# Patient Record
Sex: Female | Born: 1955 | Hispanic: Refuse to answer | Smoking: Former smoker
Health system: Southern US, Community
[De-identification: ages and names within clinical notes are randomized; demographics above are authoritative.]

## PROBLEM LIST (undated history)

## (undated) DIAGNOSIS — G4700 Insomnia, unspecified: Secondary | ICD-10-CM

## (undated) DIAGNOSIS — F32A Depression, unspecified: Secondary | ICD-10-CM

## (undated) DIAGNOSIS — F329 Major depressive disorder, single episode, unspecified: Secondary | ICD-10-CM

## (undated) HISTORY — DX: Depression, unspecified: F32.A

## (undated) HISTORY — DX: Major depressive disorder, single episode, unspecified: F32.9

## (undated) HISTORY — DX: Insomnia, unspecified: G47.00

---

## 1997-12-12 ENCOUNTER — Other Ambulatory Visit: Admission: RE | Admit: 1997-12-12 | Discharge: 1997-12-12 | Payer: Self-pay | Admitting: Obstetrics and Gynecology

## 1999-10-02 ENCOUNTER — Encounter: Payer: Self-pay | Admitting: Emergency Medicine

## 1999-10-02 ENCOUNTER — Emergency Department (HOSPITAL_COMMUNITY): Admission: EM | Admit: 1999-10-02 | Discharge: 1999-10-02 | Payer: Self-pay | Admitting: Emergency Medicine

## 2000-12-17 ENCOUNTER — Other Ambulatory Visit: Admission: RE | Admit: 2000-12-17 | Discharge: 2000-12-17 | Payer: Self-pay | Admitting: Obstetrics and Gynecology

## 2001-12-29 ENCOUNTER — Other Ambulatory Visit: Admission: RE | Admit: 2001-12-29 | Discharge: 2001-12-29 | Payer: Self-pay | Admitting: Obstetrics and Gynecology

## 2003-04-03 ENCOUNTER — Other Ambulatory Visit: Admission: RE | Admit: 2003-04-03 | Discharge: 2003-04-03 | Payer: Self-pay | Admitting: Obstetrics and Gynecology

## 2004-04-03 ENCOUNTER — Other Ambulatory Visit: Admission: RE | Admit: 2004-04-03 | Discharge: 2004-04-03 | Payer: Self-pay | Admitting: Obstetrics and Gynecology

## 2004-04-09 ENCOUNTER — Ambulatory Visit: Payer: Self-pay | Admitting: Internal Medicine

## 2004-04-16 ENCOUNTER — Ambulatory Visit: Payer: Self-pay | Admitting: Internal Medicine

## 2008-12-07 ENCOUNTER — Ambulatory Visit: Payer: Self-pay | Admitting: Family Medicine

## 2008-12-07 ENCOUNTER — Other Ambulatory Visit: Admission: RE | Admit: 2008-12-07 | Discharge: 2008-12-07 | Payer: Self-pay | Admitting: Family Medicine

## 2009-04-03 ENCOUNTER — Telehealth: Payer: Self-pay | Admitting: Family Medicine

## 2009-04-05 ENCOUNTER — Other Ambulatory Visit: Admission: RE | Admit: 2009-04-05 | Discharge: 2009-04-05 | Payer: Self-pay | Admitting: Family Medicine

## 2009-04-05 ENCOUNTER — Ambulatory Visit: Payer: Self-pay | Admitting: Family Medicine

## 2009-04-06 LAB — CONVERTED CEMR LAB
Chlamydia, DNA Probe: NEGATIVE
GC Probe Amp, Genital: NEGATIVE

## 2009-04-13 ENCOUNTER — Telehealth: Payer: Self-pay | Admitting: Family Medicine

## 2009-04-13 LAB — CONVERTED CEMR LAB

## 2009-05-03 ENCOUNTER — Ambulatory Visit: Payer: Self-pay | Admitting: Family Medicine

## 2009-05-03 LAB — CONVERTED CEMR LAB
Cholesterol: 203 mg/dL — ABNORMAL HIGH (ref 0–200)
Direct LDL: 121.1 mg/dL
HDL: 46.1 mg/dL (ref >39.00)
Total CHOL/HDL Ratio: 4
Triglycerides: 187 mg/dL — ABNORMAL HIGH (ref 0.0–149.0)
VLDL: 37.4 mg/dL (ref 0.0–40.0)

## 2009-05-25 ENCOUNTER — Encounter: Payer: Self-pay | Admitting: Family Medicine

## 2009-05-25 ENCOUNTER — Ambulatory Visit: Payer: Self-pay | Admitting: Family Medicine

## 2009-05-25 DIAGNOSIS — L02219 Cutaneous abscess of trunk, unspecified: Secondary | ICD-10-CM

## 2009-05-25 DIAGNOSIS — L03319 Cellulitis of trunk, unspecified: Secondary | ICD-10-CM

## 2009-05-28 ENCOUNTER — Ambulatory Visit: Payer: Self-pay | Admitting: Family Medicine

## 2009-06-07 ENCOUNTER — Ambulatory Visit: Payer: Self-pay | Admitting: Family Medicine

## 2009-07-26 ENCOUNTER — Ambulatory Visit: Payer: Self-pay | Admitting: Family Medicine

## 2009-08-01 ENCOUNTER — Telehealth (INDEPENDENT_AMBULATORY_CARE_PROVIDER_SITE_OTHER): Payer: Self-pay | Admitting: *Deleted

## 2009-09-04 ENCOUNTER — Ambulatory Visit: Payer: Self-pay | Admitting: Family Medicine

## 2009-09-04 DIAGNOSIS — J45909 Unspecified asthma, uncomplicated: Secondary | ICD-10-CM | POA: Insufficient documentation

## 2009-09-11 ENCOUNTER — Ambulatory Visit: Payer: Self-pay | Admitting: Family Medicine

## 2009-09-25 ENCOUNTER — Telehealth: Payer: Self-pay | Admitting: Family Medicine

## 2009-09-28 ENCOUNTER — Telehealth: Payer: Self-pay | Admitting: Family Medicine

## 2009-09-28 ENCOUNTER — Ambulatory Visit: Payer: Self-pay | Admitting: Family Medicine

## 2009-09-28 DIAGNOSIS — K219 Gastro-esophageal reflux disease without esophagitis: Secondary | ICD-10-CM

## 2009-09-28 IMAGING — CR DG CHEST 2V
2 series · 2 of 2 positions shown · non-contrast
Comparison: None.

CLINICAL DATA: Cough.

CHEST - 2 VIEW

[view not recorded (1 of 2)]
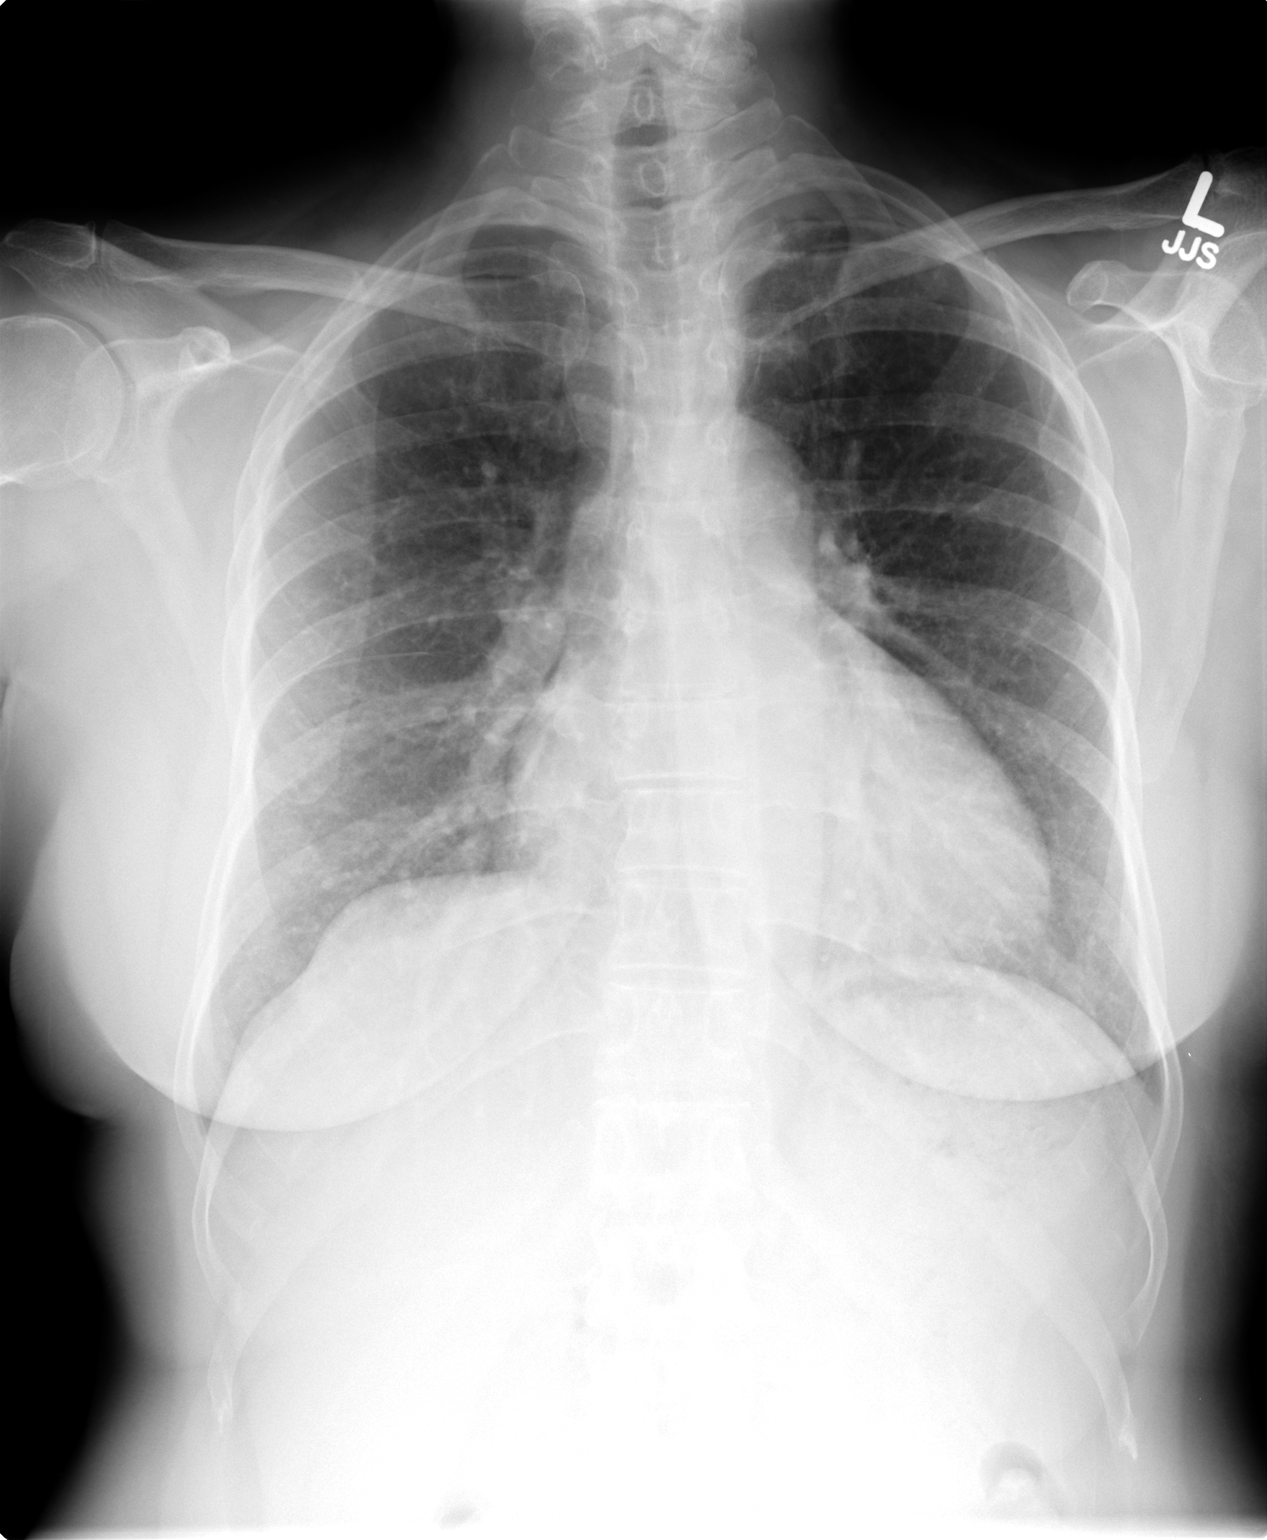

[view not recorded (2 of 2)]
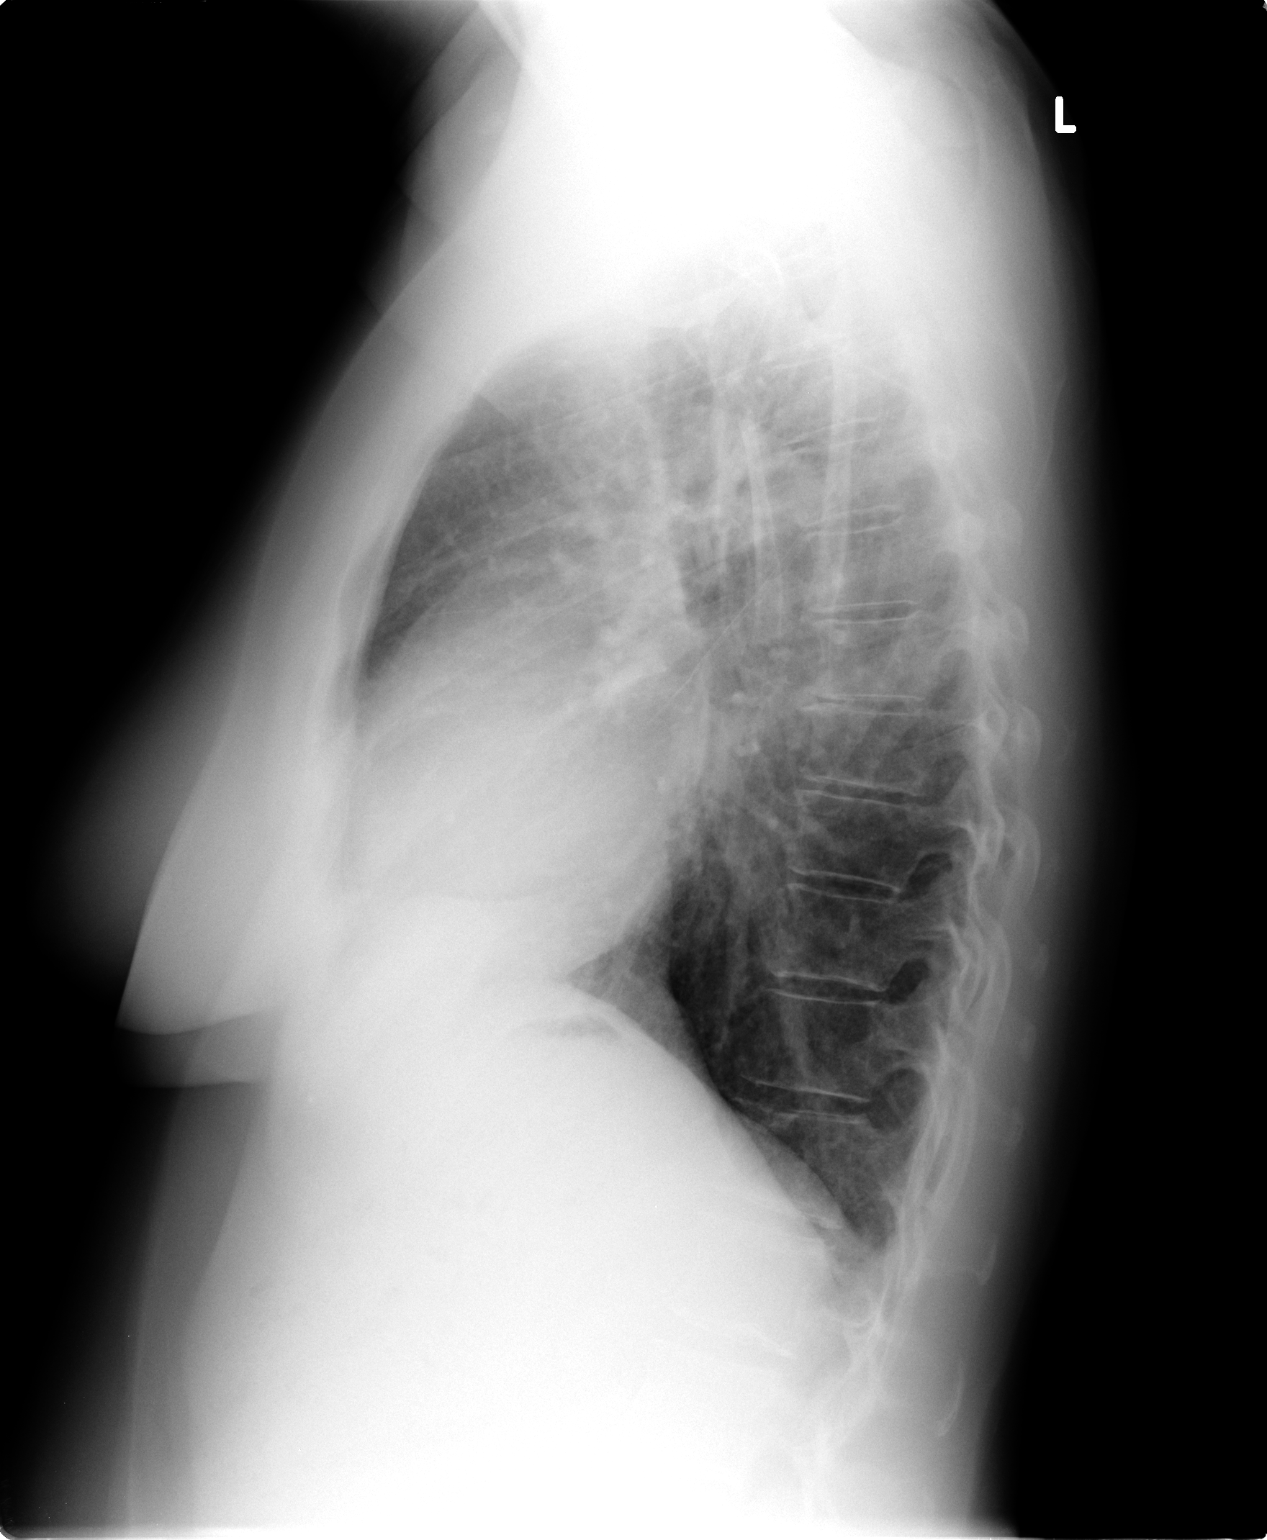

[2 of 2 positions shown; findings below may reference images not displayed]

FINDINGS: Trachea is midline.  Heart is at the upper limits of
normal in size.  Left apical pleural parenchymal scarring.  Lungs
are otherwise clear.  No pleural fluid.
IMPRESSION: No acute findings.

## 2009-10-01 ENCOUNTER — Telehealth: Payer: Self-pay | Admitting: Family Medicine

## 2009-11-12 ENCOUNTER — Ambulatory Visit: Payer: Self-pay | Admitting: Family Medicine

## 2009-11-12 DIAGNOSIS — A4902 Methicillin resistant Staphylococcus aureus infection, unspecified site: Secondary | ICD-10-CM | POA: Insufficient documentation

## 2009-11-12 DIAGNOSIS — Z8614 Personal history of Methicillin resistant Staphylococcus aureus infection: Secondary | ICD-10-CM

## 2009-11-13 ENCOUNTER — Encounter: Payer: Self-pay | Admitting: Family Medicine

## 2009-11-16 ENCOUNTER — Telehealth: Payer: Self-pay | Admitting: Family Medicine

## 2009-11-20 ENCOUNTER — Telehealth: Payer: Self-pay | Admitting: Family Medicine

## 2010-02-03 LAB — CONVERTED CEMR LAB
ALT: 33 units/L (ref 0–35)
AST: 34 units/L (ref 0–37)
Albumin: 4.3 g/dL (ref 3.5–5.2)
Alkaline Phosphatase: 77 units/L (ref 39–117)
BUN: 13 mg/dL (ref 6–23)
Basophils Absolute: 0 10*3/uL (ref 0.0–0.1)
Basophils Relative: 0.5 % (ref 0.0–3.0)
Bilirubin Urine: NEGATIVE
Bilirubin, Direct: 0 mg/dL (ref 0.0–0.3)
Blood in Urine, dipstick: NEGATIVE
CO2: 29 meq/L (ref 19–32)
Calcium: 9.5 mg/dL (ref 8.4–10.5)
Chloride: 105 meq/L (ref 96–112)
Cholesterol: 295 mg/dL — ABNORMAL HIGH (ref 0–200)
Creatinine, Ser: 0.7 mg/dL (ref 0.4–1.2)
Direct LDL: 201.4 mg/dL
Eosinophils Absolute: 0.2 10*3/uL (ref 0.0–0.7)
Eosinophils Relative: 2.5 % (ref 0.0–5.0)
GFR calc non Af Amer: 92.36 mL/min (ref >60)
Glucose, Bld: 90 mg/dL (ref 70–99)
Glucose, Urine, Semiquant: NEGATIVE
HCT: 40.2 % (ref 36.0–46.0)
HDL: 52.3 mg/dL (ref >39.00)
Hemoglobin: 13.5 g/dL (ref 12.0–15.0)
Ketones, urine, test strip: NEGATIVE
Lymphocytes Relative: 36.2 % (ref 12.0–46.0)
Lymphs Abs: 2.2 10*3/uL (ref 0.7–4.0)
MCHC: 33.6 g/dL (ref 30.0–36.0)
MCV: 89.4 fL (ref 78.0–100.0)
Monocytes Absolute: 0.5 10*3/uL (ref 0.1–1.0)
Monocytes Relative: 8.4 % (ref 3.0–12.0)
Neutro Abs: 3.3 10*3/uL (ref 1.4–7.7)
Neutrophils Relative %: 52.4 % (ref 43.0–77.0)
Nitrite: NEGATIVE
Platelets: 241 10*3/uL (ref 150.0–400.0)
Potassium: 3.8 meq/L (ref 3.5–5.1)
Protein, U semiquant: NEGATIVE
RBC: 4.49 M/uL (ref 3.87–5.11)
RDW: 12.6 % (ref 11.5–14.6)
Sodium: 142 meq/L (ref 135–145)
Specific Gravity, Urine: 1.025
TSH: 0.91 microintl units/mL (ref 0.35–5.50)
Total Bilirubin: 0.8 mg/dL (ref 0.3–1.2)
Total CHOL/HDL Ratio: 6
Total Protein: 7.9 g/dL (ref 6.0–8.3)
Triglycerides: 240 mg/dL — ABNORMAL HIGH (ref 0.0–149.0)
Urobilinogen, UA: 0.2
VLDL: 48 mg/dL — ABNORMAL HIGH (ref 0.0–40.0)
WBC Urine, dipstick: NEGATIVE
WBC: 6.2 10*3/uL (ref 4.5–10.5)
pH: 5

## 2010-02-05 NOTE — Progress Notes (Signed)
Summary: requesting Bactroban  Phone Note Call from Patient   Caller: Patient Call For: Roderick Pee MD Summary of Call: Pt is asking for Bactroban due to her history of MERSA. [phone] Initial call taken by: Lynann Beaver CMA,  August 01, 2009 11:38 AM  Follow-up for Phone Call        30 gms ok Follow-up by: Gordy Savers  MD,  August 02, 2009 8:05 AM    New/Updated Medications: BACTROBAN 2 % OINT (MUPIROCIN) Apply bid Prescriptions: BACTROBAN 2 % OINT (MUPIROCIN) Apply bid  #1 gm x 0   Entered by:   Lynann Beaver CMA   Authorized by:   Gordy Savers  MD   Signed by:   Lynann Beaver CMA on 08/02/2009   Method used:   Electronically to        [facility]  [phone]* (retail)       [address]       [place], [place]  [address]       Ph: [phone] or [phone]       Fax: [phone]   RxID:   [id number]  Pt. notified.

## 2010-02-05 NOTE — Progress Notes (Signed)
Summary: REQ FOR RETURN CALL?  Phone Note Call from Patient Call back at [phone]   Caller: Patient Reason for Call: Talk to Nurse Summary of Call: Pt called to speak with Fleet Contras, CMA (currently with a patient).....would not elaborate on reason for call.... Pt req a return call at your convenience @ [phone].  Initial call taken by: Debbra Riding,  April 13, 2009 2:33 PM  Follow-up for Phone Call        Phone Call Completed Follow-up by: Kern Reap CMA Duncan Dull),  April 13, 2009 2:48 PM

## 2010-02-05 NOTE — Progress Notes (Signed)
Summary: Pt req call back from [facility] today  Phone Note Call from Patient Call back at [facility] Phone [phone]   Caller: Patient Summary of Call: Pt is req a call back from [place] today.  Initial call taken by: Lucy Antigua,  November 20, 2009 11:21 AM  Follow-up for Phone Call        the 2 bumps are down but not all the way gone. should she finished the rx for ATB? Follow-up by: Kern Reap CMA Duncan Dull),  November 20, 2009 5:06 PM  Additional Follow-up for Phone Call Additional follow up Details #1::        continue the antibiotics until all the bumps are completely gone Additional Follow-up by: Roderick Pee MD,  November 20, 2009 5:29 PM    Additional Follow-up for Phone Call Additional follow up Details #2::    patient is aware Follow-up by: Kern Reap CMA Duncan Dull),  November 21, 2009 1:27 PM

## 2010-02-05 NOTE — Assessment & Plan Note (Signed)
Summary: congestion//ccm   Vital Signs:  Patient profile:   55 year old female Menstrual status:  postmenopausal Weight:      154 pounds Temp:     98.5 degrees F oral BP sitting:   110 / 78  (left arm) Cuff size:   regular  Vitals Entered By: Kern Reap CMA Duncan Dull) (November 12, 2009 12:14 PM) CC: MRSA concerns   CC:  MRSA concerns.  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  Last Tuesday she noticed a bump on her right thigh, one on her posterior left thigh, and two, bumps on her right labia.  Last night she began her antibiotics again.  This may interfere with the culture report.  Also, we discussed in the past trying to use pHisoHex.  She never got the prescription filled.  However, now she would like to try  Allergies: No Known Drug Allergies  Past History:  Past medical, surgical, family and social histories (including risk factors) reviewed, and no changes noted (except as noted below).  Past Medical History: Reviewed history from 09/28/2009 and no changes required. depression hx chicken pox high cholesterol IBS diagnosed at age 45 years of age with TB treatment???????  Past Surgical History: Reviewed history from 12/07/2008 and no changes required. Caesarean section x 2 Tonsillectomy  Family History: Reviewed history from 12/07/2008 and no changes required. Father: alcoholism, HTN, depression Mother: died - DM, HTN Siblings: 2 brothers - 1 died drugs/HIV               3 sisters - healthy Children: 2 - 1 boy and 1 girl - healthy  Social History: Reviewed history from 12/07/2008 and no changes required. 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 Regular exercise-yes  Review of Systems      See HPI  Physical Exam  General:  Well-developed,well-nourished,in no acute distress; alert,appropriate and cooperative throughout examination Skin:  one posterior  lesion, right anterior thigh.  A second posterior lesion posterior left thigh, which was cultured also to posterior lesions on her labia   Impression & Recommendations:  Problem # 1:  MRSA (OVF-643.32) Assessment Deteriorated  Orders: Specimen Handling ([id number]) T-Culture, Wound ([id number]) Prescription Created Electronically [phone])  Complete Medication List: 1)  Ativan 0.5 Mg Tabs (Lorazepam) .Marland Kitchen.. 1 tab @ bedtime as needed 2)  Bactroban 2 % Oint (Mupirocin) .... Apply bid 3)  Acyclovir 400 Mg Tabs (Acyclovir) .... Take 1 tablet by mouth three times a day 4)  Qvar 40 Mcg/act Aers (Beclomethasone dipropionate) .... One .puff two times a day 5)  Doxycycline Hyclate 100 Mg Caps (Doxycycline hyclate) .... Take 1 tablet by mouth two times a day 6)  Septra Ds 800-160 Mg Tabs (Sulfamethoxazole-trimethoprim) .... Take 1 tablet by mouth two times a day 7)  Phisohex 3 % Liqd (Hexachlorophene) .... Wash 3 x week as directed  Patient Instructions: 1)  in addition to dating daily with soap and water.  I would recommend you add the pHisoHex 3 times a week. 2)  Begin doxycycline and Septra one of each twice daily.  Return in two weeks for follow-up. Prescriptions: PHISOHEX 3 % LIQD (HEXACHLOROPHENE) wash 3 x week as directed  #1 pint x 6   Entered and Authorized by:   Roderick Pee MD   Signed by:   Roderick Pee MD on 11/12/2009   Method used:   Print then Give to Patient   RxID:  [id number] PHISOHEX 3 % LIQD (HEXACHLOROPHENE) wash 3 x week as directed  #1 pint x 6   Entered and Authorized by:   Roderick Pee MD   Signed by:   Roderick Pee MD on 11/12/2009   Method used:   Electronically to        [facility]  [phone]* (retail)       [address]       [place], [place]  [address]       Ph: [phone] or [phone]       Fax: [phone]   RxID:   [id number] SEPTRA DS 800-160 MG TABS (SULFAMETHOXAZOLE-TRIMETHOPRIM) Take 1  tablet by mouth two times a day  #60 x 1   Entered and Authorized by:   Roderick Pee MD   Signed by:   Roderick Pee MD on 11/12/2009   Method used:   Electronically to        [facility]  [phone]* (retail)       [address]       [place], [place]  [address]       Ph: [phone] or [phone]       Fax: [phone]   RxID:   [id number] DOXYCYCLINE HYCLATE 100 MG CAPS (DOXYCYCLINE HYCLATE) Take 1 tablet by mouth two times a day  #60 x 1   Entered and Authorized by:   Roderick Pee MD   Signed by:   Roderick Pee MD on 11/12/2009   Method used:   Electronically to        [facility]  [phone]* (retail)       [address]       [place], [place]  [address]       Ph: [phone] or [phone]       Fax: [phone]   RxID:   [id number]    Orders Added: 1)  T-Culture, Wound [87070/[id number]] 2)  Specimen Handling [99000] 3)  T-Culture, Wound [87070/[id number]] 4)  Est. Patient Level III [[address]] 5)  Prescription Created Electronically [phone]

## 2010-02-05 NOTE — Assessment & Plan Note (Signed)
Summary: follow up lab result - rv   Vital Signs:  Patient profile:   55 year old female Menstrual status:  postmenopausal BP sitting:   110 / 76  (left arm) Cuff size:   regular  Vitals Entered By: Kern Reap CMA Duncan Dull) (May 28, 2009 4:23 PM) CC: follow-up visit   CC:  follow-up visit.  History of Present Illness: Ronique is a 54 year old female, who comes in today for reevaluation of MRSA.  She had a boil in her left groin.  It was drained 4 days ago.  Culture showed MRSA with the usual sensitivities.  She's been on doxycycline 100 mg b.i.d., but it hasn't helped very much.  She also has a second smaller lesion in the right groin  Allergies: No Known Drug Allergies  Past History:  Past medical, surgical, family and social histories (including risk factors) reviewed for relevance to current acute and chronic problems.  Past Medical History: Reviewed history from 12/07/2008 and no changes required. depression hx chicken pox high cholesterol IBS  Past Surgical History: Reviewed history from 12/07/2008 and no changes required. Caesarean section x 2 Tonsillectomy  Family History: Reviewed history from 12/07/2008 and no changes required. Father: alcoholism, HTN, depression Mother: died - DM, HTN Siblings: 2 brothers - 1 died drugs/HIV               3 sisters - healthy Children: 2 - 1 boy and 1 girl - healthy  Social History: Reviewed history from 12/07/2008 and no changes required. 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 Regular exercise-yes  Review of Systems      See HPI  Physical Exam  General:  Well-developed,well-nourished,in no acute distress; alert,appropriate and cooperative throughout examination Skin:  golf ball-sized lesion, left groin 6 mm x 6 mm very small lesion, right groin to the left has a open port draining pus   Impression & Recommendations:  Problem # 1:  CELLULITIS AND ABSCESS OF TRUNK  (ICD-682.2) Assessment Unchanged  Her updated medication list for this problem includes:    Doxycycline Hyclate 100 Mg Caps (Doxycycline hyclate) .Marland Kitchen..Marland Kitchen Two times a day    Septra Ds 800-160 Mg Tabs (Sulfamethoxazole-trimethoprim) .Marland Kitchen... Take 1 tablet by mouth two times a day  Complete Medication List: 1)  Ativan 0.5 Mg Tabs (Lorazepam) .Marland Kitchen.. 1 tab @ bedtime as needed 2)  Doxycycline Hyclate 100 Mg Caps (Doxycycline hyclate) .... Two times a day 3)  Vicodin 5-500 Mg Tabs (Hydrocodone-acetaminophen) .Marland Kitchen.. 1 q 4 hours as needed pain 4)  Septra Ds 800-160 Mg Tabs (Sulfamethoxazole-trimethoprim) .... Take 1 tablet by mouth two times a day  Patient Instructions: 1)  continue the doxycycline, one twice a day, add  Septra, one twice a day.  Return Friday for follow-up.  Warm soaks 15 minutes 4 times a day Prescriptions: SEPTRA DS 800-160 MG TABS (SULFAMETHOXAZOLE-TRIMETHOPRIM) Take 1 tablet by mouth two times a day  #30 x 1   Entered and Authorized by:   Roderick Pee MD   Signed by:   Roderick Pee MD on 05/28/2009   Method used:   Electronically to        [facility]  [phone]* (retail)       [address]       [place], [place]  [address]       Ph: [phone] or [phone]       Fax: [phone]   RxID:   [id number]

## 2010-02-05 NOTE — Progress Notes (Signed)
Summary: dr Tawanna Cooler please call  Phone Note Call from Patient Call back at Home Phone [phone]   Caller: Patient Call For: Roderick Pee MD Summary of Call: pt decline to make ov, she is requesting to talk to doc first Initial call taken by: Heron Sabins,  April 03, 2009 9:10 AM  Follow-up for Phone Call        left message on machine for patient to return our call Follow-up by: Kern Reap CMA Duncan Dull),  April 03, 2009 10:02 AM  Additional Follow-up for Phone Call Additional follow up Details #1::        spoke with patient and she refused to give any information and requests to only speak with Dr. Tawanna Cooler Additional Follow-up by: Kern Reap CMA Duncan Dull),  April 03, 2009 10:07 AM    Additional Follow-up for Phone Call Additional follow up Details #2::    I called the home with no answer........ I called her work number and it rang, and rang, and nobody answered...........Marland Kitchen finally called the work number again, and got an answering machine.  Requested.  Patient come to the office for eval Follow-up by: Roderick Pee MD,  April 03, 2009 1:07 PM

## 2010-02-05 NOTE — Progress Notes (Signed)
  Phone Note Outgoing Call   Summary of Call: I called Melissa Welch in to tell her her chest x-ray was normal Initial call taken by: Roderick Pee MD,  September 28, 2009 5:32 PM

## 2010-02-05 NOTE — Assessment & Plan Note (Signed)
Summary: rash on buttocks/dm   Vital Signs:  Patient profile:   55 year old female Menstrual status:  postmenopausal Weight:      145 pounds Temp:     98.1 degrees F oral BP sitting:   120 / 70  (left arm) Cuff size:   regular  Vitals Entered By: Kathrynn Speed CMA (September 11, 2009 11:39 AM) CC: rash on buttocks, x 2 days, been  a beach, src   CC:  rash on buttocks, x 2 days, been  a beach, and src.  History of Present Illness: Melissa Welch is a 55 year old female, who comes in today for evaluation of a painful rash on her buttocks.  Over the weekend.  She was at the beach and noticed pain in them broke out in a rash.  No previous history of shingles in the past, except that her GYN had given her some Zovirax back in 2008 which sounds like it was for more like cutaneous herpes.  Current Medications (verified): 1)  Ativan 0.5 Mg Tabs (Lorazepam) .Marland Kitchen.. 1 Tab @ Bedtime As Needed 2)  Phisohex 3 % Liqd (Hexachlorophene) .... Apply 3 X Week 3)  Bactroban 2 % Oint (Mupirocin) .... Apply Bid 4)  Prednisone 20 Mg Tabs (Prednisone) .... Uad  Allergies (verified): No Known Drug Allergies  Past History:  Past medical, surgical, family and social histories (including risk factors) reviewed for relevance to current acute and chronic problems.  Past Medical History: Reviewed history from 12/07/2008 and no changes required. depression hx chicken pox high cholesterol IBS  Past Surgical History: Reviewed history from 12/07/2008 and no changes required. Caesarean section x 2 Tonsillectomy  Family History: Reviewed history from 12/07/2008 and no changes required. Father: alcoholism, HTN, depression Mother: died - DM, HTN Siblings: 2 brothers - 1 died drugs/HIV               3 sisters - healthy Children: 2 - 1 boy and 1 girl - healthy  Social History: Reviewed history from 12/07/2008 and no changes required. 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 1 tab @ bedtime as needed 2)  Phisohex 3 % Liqd (Hexachlorophene) .... Apply 3 x week 3)  Bactroban 2 % Oint (Mupirocin) .... Apply bid 4)  Prednisone 20 Mg Tabs (Prednisone) .... Uad 5)  Acyclovir 400 Mg Tabs (Acyclovir) .... Take 1 tablet by mouth three times a day  Patient Instructions: 1)  begin Zovirax 400 mg 3 times a day.  Also, Motrin, 600 mg twice a day...............and Vicodin one half to one tablet at bedtime as needed for severe pain 2)  Please schedule a follow-up appointment as needed. Prescriptions: ACYCLOVIR 400 MG TABS (ACYCLOVIR) Take 1 tablet by mouth three times a day  #30 x 2   Entered and Authorized by:   Roderick Pee MD   Signed by:   Roderick Pee MD on 09/11/2009   Method used:  Electronically to        [facility]  [phone]* (retail)       [address]       [place], [place]  [address]       Ph: [phone] or [phone]       Fax: [phone]   RxID:   [id number]

## 2010-02-05 NOTE — Assessment & Plan Note (Signed)
Summary: fup cough//ccm   Vital Signs:  Patient profile:   55 year old female Menstrual status:  postmenopausal Weight:      150 pounds Temp:     98.2 degrees F oral BP sitting:   110 / 80  (left arm) Cuff size:   regular  Vitals Entered By: Kern Reap CMA Duncan Dull) (September 28, 2009 1:58 PM) CC: chest congestion, cough   CC:  chest congestion and cough.  History of Present Illness: Melissa Welch is a 55 year old nonsmoking female, who comes in today with a recurrent cough.  Two months ago, she developed some head congestion, postnasal drip, and a slight cough.  We saw her on August 30 because the cough had gotten worse.  At that time on physical examination she was wheezing.  We placed her burst and prednisone taper, which she finished September, the 16th however, the cough, although decreased.  Has not gone.  Again, no fever, chills, sputum production, Septra.  No environmental changes.  She also had an episode of Mercer in the left groin that is resolved.  Also she had an episode of shingles in the left buttocks.  The result of acyclovir.  She's wondering at the something wrong with her immune system.  She states she was diagnosed with TB when she was 55 years of age.  Does not recall the type of therapy.  Review of systems pertinent in, the she's also having symptoms of reflux esophagitis.  She states a couple nights a week, when she lies, down.  She'll feel burning in her chest.  We discussed the possible link  between reflux and wheezing  Allergies: No Known Drug Allergies  Past History:  Past medical, surgical, family and social histories (including risk factors) reviewed for relevance to current acute and chronic problems.  Past Medical History: depression hx chicken pox high cholesterol IBS diagnosed at age 63 years of age with TB treatment???????  Past Surgical History: Reviewed history from 12/07/2008 and no changes required. Caesarean section x  2 Tonsillectomy  Family History: Reviewed history from 12/07/2008 and no changes required. Father: alcoholism, HTN, depression Mother: died - DM, HTN Siblings: 2 brothers - 1 died drugs/HIV               3 sisters - healthy Children: 2 - 1 boy and 1 girl - healthy  Social History: Reviewed history from 12/07/2008 and no changes required. 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 No apparent alopecia or balding. Eyes:  No corneal or conjunctival inflammation noted. EOMI. Perrla. Funduscopic exam benign, without hemorrhages, exudates or papilledema. Vision grossly normal. Ears:  External ear exam shows no significant lesions or deformities.  Otoscopic examination reveals clear canals, tympanic membranes are intact bilaterally without bulging, retraction, inflammation or discharge. Hearing is grossly normal bilaterally. Nose:  External nasal examination shows no deformity or inflammation. Nasal mucosa are pink and moist without lesions or exudates. Mouth:  Oral mucosa and oropharynx without lesions or exudates.  Teeth in good repair. Neck:  No deformities, masses, or tenderness noted. Chest Wall:  No deformities, masses, or tenderness noted. Lungs:  Normal respiratory effort, chest expands symmetrically. Lungs are clear to auscultation, no crackles or wheezes. Heart:  Normal rate and regular rhythm. S1 and S2 normal without gallop, murmur, click,  rub or other extra sounds.   Problems:  Medical Problems Added: 1)  Dx of Gerd  (ICD-530.81) 2)  Dx of Cough  (ICD-786.2)  Impression & Recommendations:  Problem # 1:  GERD (ICD-530.81) Assessment New  Orders: Prescription Created Electronically  [phone])  Problem # 2:  COUGH (ICD-786.2) Assessment: Unchanged  Orders: Prescription Created Electronically [phone])  Complete Medication List: 1)  Ativan 0.5 Mg Tabs (Lorazepam) .Marland Kitchen.. 1 tab @ bedtime as needed 2)  Phisohex 3 % Liqd (Hexachlorophene) .... Apply 3 x week 3)  Bactroban 2 % Oint (Mupirocin) .... Apply bid 4)  Acyclovir 400 Mg Tabs (Acyclovir) .... Take 1 tablet by mouth three times a day 5)  Qvar 40 Mcg/act Aers (Beclomethasone dipropionate) .... One .puff two times a day  Other Orders: T-2 View CXR ([id number])  Patient Instructions: 1)  begin Qvar one puff twice daily, begin B. anti-reflux program.  The things to continue to avoid on nicotine, alcohol, caffeine, peppermint, and take Prilosec 20 mg b.i.d., and nothing to eat or drink for two hours prior to bedtime and sleep on two pillows.  Return in 3 weeks for follow-up, sooner if any problems Prescriptions: QVAR 40 MCG/ACT AERS (BECLOMETHASONE DIPROPIONATE) one .puff two times a day  #1 x 1   Entered and Authorized by:   Roderick Pee MD   Signed by:   Roderick Pee MD on 09/28/2009   Method used:   Electronically to        [facility]  [phone]* (retail)       [address]       [place], [place]  [address]       Ph: [phone] or [phone]       Fax: [phone]   RxID:   [id number]

## 2010-02-05 NOTE — Progress Notes (Signed)
Summary: pt req chest xray for cough  Phone Note Call from Patient Call back at Home Phone [phone]   Caller: Patient Call For: Roderick Pee MD Summary of Call: pt has dry cough would like chest xray. pt was seen in aug 2011 for her cough Initial call taken by: Heron Sabins,  September 25, 2009 11:58 AM  Follow-up for Phone Call        Fleet Contras please call Byrd Hesselbach, since she's not well and he does see her today for a full evaluation Follow-up by: Roderick Pee MD,  September 25, 2009 12:33 PM  Additional Follow-up for Phone Call Additional follow up Details #1::        patient is aware and will call back for appointment Additional Follow-up by: Kern Reap CMA Duncan Dull),  September 25, 2009 12:40 PM

## 2010-02-05 NOTE — Assessment & Plan Note (Signed)
Summary: ? boil//ccm   Vital Signs:  Patient profile:   55 year old female Menstrual status:  postmenopausal Weight:      146 pounds Temp:     98.8 degrees F oral BP sitting:   124 / 70  (left arm) Cuff size:   regular  Vitals Entered By: Raechel Ache, RN (May 25, 2009 2:44 PM) CC: C/o ? boil L groin since Tuesday, getting worse and very painful. Has another spot R side. Genitalia burning. Chills   History of Present Illness: Here with 4 days of a painful swollen boil in the left groin. She also has a much smaller one in the right groin. She wonders if these could be spider bites since she was up in her attic around the time they appeared. The boil in the left groin has steadily gotten larger and more painful. No fevers or other symptoms.   Allergies (verified): No Known Drug Allergies  Past History:  Past Medical History: Reviewed history from 12/07/2008 and no changes required. depression hx chicken pox high cholesterol IBS  Past Surgical History: Reviewed history from 12/07/2008 and no changes required. Caesarean section x 2 Tonsillectomy  Review of Systems  The patient denies anorexia, fever, weight loss, weight gain, vision loss, decreased hearing, hoarseness, chest pain, syncope, dyspnea on exertion, peripheral edema, prolonged cough, headaches, hemoptysis, abdominal pain, melena, hematochezia, severe indigestion/heartburn, hematuria, incontinence, genital sores, muscle weakness, suspicious skin lesions, transient blindness, difficulty walking, depression, unusual weight change, abnormal bleeding, enlarged lymph nodes, angioedema, breast masses, and testicular masses.    Physical Exam  General:  in pain, alert Abdomen:  there is a tiny slightly tender pustule in the left groin. There is a large, red, swollen, warm and very tender boil in the left groin.    Impression & Recommendations:  Problem # 1:  CELLULITIS AND ABSCESS OF TRUNK (ICD-682.2)  Her updated  medication list for this problem includes:    Doxycycline Hyclate 100 Mg Caps (Doxycycline hyclate) .Marland Kitchen..Marland Kitchen Two times a day  Orders: T-Culture, Wound (87070/87205-70190) I&D Abscess, Simple / Single (10060) Rocephin  250mg  (Z6109) Admin of Therapeutic Inj  intramuscular or subcutaneous (60454)  Complete Medication List: 1)  Ativan 0.5 Mg Tabs (Lorazepam) .Marland Kitchen.. 1 tab @ bedtime as needed 2)  Doxycycline Hyclate 100 Mg Caps (Doxycycline hyclate) .... Two times a day 3)  Vicodin 5-500 Mg Tabs (Hydrocodone-acetaminophen) .Marland Kitchen.. 1 q 4 hours as needed pain  Patient Instructions: 1)  This is concerning for a MRSA infection, so we will cover with Doxycycline. The area was cleansed with alcohol, then we incised the boil with a scalpel. The contents were swabbed and we sent this off for a culture. Dressed with gauze. Given a Rocephin shot. She can soak in a hot bathtub for comfort.  Prescriptions: VICODIN 5-500 MG TABS (HYDROCODONE-ACETAMINOPHEN) 1 q 4 hours as needed pain  #30 x 0   Entered and Authorized by:   Nelwyn Salisbury MD   Signed by:   Nelwyn Salisbury MD on 05/25/2009   Method used:   Print then Give to Patient   RxID:   [id number] DOXYCYCLINE HYCLATE 100 MG CAPS (DOXYCYCLINE HYCLATE) two times a day  #28 x 0   Entered and Authorized by:   Nelwyn Salisbury MD   Signed by:   Nelwyn Salisbury MD on 05/25/2009   Method used:   Print then Give to Patient   RxID:   [id number]    Medication Administration  Injection #  1:    Medication: Rocephin  250mg     Diagnosis: CELLULITIS AND ABSCESS OF TRUNK (ICD-682.2)    Route: IM    Site: LUOQ gluteus    Exp Date: 11/13    Lot #: [id number]    Mfr: novaplus    Comments: 1 gram given    Patient tolerated injection without complications    Given by: Raechel Ache, RN (May 25, 2009 4:25 PM)  Orders Added: 1)  T-Culture, Wound [87070/[id number]] 2)  I&D Abscess, Simple / Single [10060] 3)  Rocephin  250mg  [J0696] 4)  Admin of Therapeutic Inj   intramuscular or subcutaneous [82956]

## 2010-02-05 NOTE — Assessment & Plan Note (Signed)
Summary: CHEST CONGESTION/NJR   Vital Signs:  Patient profile:   55 year old female Menstrual status:  postmenopausal Height:      61 inches Weight:      153 pounds BMI:     29.01 Temp:     98.3 degrees F oral BP sitting:   130 / 90  (left arm) Cuff size:   regular  Vitals Entered By: Kern Reap CMA Duncan Dull) (September 04, 2009 12:18 PM) CC: chest congestion   CC:  chest congestion.  History of Present Illness: Margan is a 55 year old female, nonsmoker, who comes in today for evaluation of asthma.  She has a history of intermittent allergic rhinitis.  A month ago she began coughing.  Interestingly enough her sneezing, runny nose, etc., when away when the wheezing started.  No history of asthma in the past.  Environmental review of systems negative  Allergies: No Known Drug Allergies  Past History:  Past Medical History: Last updated: 12/07/2008 depression hx chicken pox high cholesterol IBS  Family History: Last updated: 12/07/2008 Father: alcoholism, HTN, depression Mother: died - DM, HTN Siblings: 2 brothers - 1 died drugs/HIV               3 sisters - healthy Children: 2 - 1 boy and 1 girl - healthy  Social History: Last updated: 12/07/2008 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 Regular exercise-yes  Social History: Reviewed history from 12/07/2008 and no changes required. 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 No apparent alopecia or balding. Eyes:  No corneal or conjunctival inflammation noted. EOMI. Perrla. Funduscopic exam benign, without hemorrhages, exudates or papilledema. Vision grossly normal. Ears:  External ear exam shows no  significant lesions or deformities.  Otoscopic examination reveals clear canals, tympanic membranes are intact bilaterally without bulging, retraction, inflammation or discharge. Hearing is grossly normal bilaterally. Nose:  External nasal examination shows no deformity or inflammation. Nasal mucosa are pink and moist without lesions or exudates. Mouth:  Oral mucosa and oropharynx without lesions or exudates.  Teeth in good repair. Neck:  No deformities, masses, or tenderness noted. Chest Wall:  No deformities, masses, or tenderness noted. Lungs:  symmetrical breath sounds with expiratory wheezing bilaterally   Problems:  Medical Problems Added: 1)  Dx of Asthma  (ICD-493.90)  Impression & Recommendations:  Problem # 1:  ASTHMA (ICD-493.90) Assessment New  Her updated medication list for this problem includes:    Prednisone 20 Mg Tabs (Prednisone) ..... Uad  Orders: Prescription Created Electronically [phone])  Complete Medication List: 1)  Ativan 0.5 Mg Tabs (Lorazepam) .Marland Kitchen.. 1 tab @ bedtime as needed 2)  Phisohex 3 % Liqd (Hexachlorophene) .... Apply 3 x week 3)  Bactroban 2 % Oint (Mupirocin) .... Apply bid 4)  Prednisone 20 Mg Tabs (Prednisone) .... Uad  Patient Instructions: 1)  begin prednisone 20-mg tablets directions two tabs x 3 days, one x 3 days, a half x 3 days, then half a tablet Monday, Wednesday, Friday, for a two week taper.  Once he stopped the prednisone completely then take a plain Claritin in the morning or a plain Zyrtec at bedtime daily Prescriptions: PREDNISONE 20 MG TABS (PREDNISONE) UAD  #30 x 1   Entered and Authorized by:   Roderick Pee MD  Signed by:   Roderick Pee MD on 09/04/2009   Method used:   Electronically to        [facility]  [phone]* (retail)       [address]       [place], [place]  [address]       Ph: [phone] or [phone]       Fax: [phone]   RxID:   [id number]

## 2010-02-05 NOTE — Assessment & Plan Note (Signed)
Summary: PERSONAL CONCERNS // RS   Vital Signs:  Patient profile:   55 year old female Menstrual status:  postmenopausal Weight:      150 pounds Temp:     98.3 degrees F oral BP sitting:   110 / 80  (left arm) Cuff size:   regular  Vitals Entered By: Kern Reap CMA Duncan Dull) (April 05, 2009 12:20 PM) CC: std   CC:  std.  History of Present Illness: Melissa Welch is a 55 year old single female, nonsmoker, who has been in a stable relationship for over 3 years.  He now finds out that her S. O. has had multiple girlfriends.  She has no symptoms of any STDs, but she wants to be screened.  That had vaginal and oral but no anal sex.    Allergies: No Known Drug Allergies  Past History:  Past medical, surgical, family and social histories (including risk factors) reviewed for relevance to current acute and chronic problems.  Past Medical History: Reviewed history from 12/07/2008 and no changes required. depression hx chicken pox high cholesterol IBS  Past Surgical History: Reviewed history from 12/07/2008 and no changes required. Caesarean section x 2 Tonsillectomy  Family History: Reviewed history from 12/07/2008 and no changes required. Father: alcoholism, HTN, depression Mother: died - DM, HTN Siblings: 2 brothers - 1 died drugs/HIV               3 sisters - healthy Children: 2 - 1 boy and 1 girl - healthy  Social History: Reviewed history from 12/07/2008 and no changes required. 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 Genitalia:  Pelvic Exam:        External: normal female genitalia without lesions or masses        Vagina: normal without lesions  or masses        Cervix: normal without lesions or masses        Adnexa: normal bimanual exam without masses or fullness        Uterus: normal by palpation        Pap smear: not performed   Impression & Recommendations:  Problem # 1:  SCREENING OTHER&UNSPEC GENITOURINARY CONDITION (ICD-V81.6) Assessment New  Orders: Venipuncture (29562) T-HIV Antibody  (Reflex) ([id number]) T-GC Probe, genital (96295-28413) T-Chlamydia Probe, genital (24401-02725)  Complete Medication List: 1)  Ativan 0.5 Mg Tabs (Lorazepam) .Marland Kitchen.. 1 tab @ bedtime as needed  Patient Instructions: 1)  I will call you when I get your lab work back Prescriptions: ATIVAN 0.5 MG TABS (LORAZEPAM) 1 tab @ bedtime as needed  #30 x 2   Entered and Authorized by:   Roderick Pee MD   Signed by:   Roderick Pee MD on 04/05/2009   Method used:   Print then Give to Patient   RxID:   [id number]

## 2010-02-05 NOTE — Progress Notes (Signed)
Summary: rx for phisonex?  Phone Note Call from Patient   Caller: Patient Call For: Roderick Pee MD Reason for Call: Talk to Doctor Summary of Call: patient is calling because the rx for phisonex will cost $50 is there anything else she can use? Initial call taken by: Kern Reap CMA Duncan Dull),  November 16, 2009 2:04 PM  Follow-up for Phone Call        no Follow-up by: Roderick Pee MD,  November 19, 2009 8:27 AM  Additional Follow-up for Phone Call Additional follow up Details #1::        left message on machine for patient  Additional Follow-up by: Kern Reap CMA Duncan Dull),  November 19, 2009 12:02 PM

## 2010-02-05 NOTE — Assessment & Plan Note (Signed)
Summary: lump in armpit/painful/cjr   Vital Signs:  Patient profile:   55 year old female Menstrual status:  postmenopausal Weight:      145 pounds Temp:     98 degrees F oral BP sitting:   120 / 80  (right arm)  History of Present Illness: Melissa Welch is a 55 year old female, who comes in today for evaluation of a red, swollen, lesion in her left axillary area x 3 days.  She also has a bump on her right groin.  In May.  She had MRSA.......Marland Kitchen we treated her the results spontaneously.  Now for the last 3 days, she she's noticed a sore lump in her left axillary and also a small pimple in her right groin  Allergies: No Known Drug Allergies  Past History:  Past medical, surgical, family and social histories (including risk factors) reviewed for relevance to current acute and chronic problems.  Past Medical History: Reviewed history from 12/07/2008 and no changes required. depression hx chicken pox high cholesterol IBS  Past Surgical History: Reviewed history from 12/07/2008 and no changes required. Caesarean section x 2 Tonsillectomy  Family History: Reviewed history from 12/07/2008 and no changes required. Father: alcoholism, HTN, depression Mother: died - DM, HTN Siblings: 2 brothers - 1 died drugs/HIV               3 sisters - healthy Children: 2 - 1 boy and 1 girl - healthy  Social History: Reviewed history from 12/07/2008 and no changes required. 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 Take 1 tablet by mouth two times a day    Doxycycline Hyclate 100 Mg Caps (Doxycycline hyclate) .Marland Kitchen... Take 1 tablet by mouth two times a day  Orders: Prescription Created Electronically [phone])  Complete Medication List: 1)  Ativan 0.5 Mg Tabs (Lorazepam) .Marland Kitchen.. 1 tab @ bedtime as needed 2)  Vicodin 5-500 Mg Tabs (Hydrocodone-acetaminophen) .Marland Kitchen.. 1 q 4 hours as needed pain 3)  Septra Ds 800-160 Mg Tabs (Sulfamethoxazole-trimethoprim) .... Take 1 tablet by mouth two times a day 4)  Doxycycline Hyclate 100 Mg Caps (Doxycycline hyclate) .... Take 1 tablet by mouth two times a day 5)  Phisohex 3 % Liqd (Hexachlorophene) .... Apply 3 x week  Patient Instructions: 1)  doxycycline 100 mg twice a day, along with Septra DS twice daily.  Also scrubbed the areas with pHisoHex soap 3 times weekly.  Return p.r.n. Prescriptions: SEPTRA DS 800-160 MG TABS (SULFAMETHOXAZOLE-TRIMETHOPRIM) Take 1 tablet by mouth two times a day  #40 x 1   Entered and Authorized by:   Roderick Pee MD   Signed by:   Roderick Pee MD on 07/26/2009   Method used:   Electronically to        [facility]  [phone]* (retail)       [address]       [place], [place]  [address]       Ph: [phone] or [phone]  Fax: [phone]   RxID:   [id number] PHISOHEX 3 % LIQD (HEXACHLOROPHENE) apply 3 x week  #1 pint x 3   Entered and Authorized by:   Roderick Pee MD   Signed by:   Roderick Pee MD on 07/26/2009   Method used:   Electronically to        [facility]  [phone]* (retail)       [address]       [place], [place]  [address]       Ph: [phone] or [phone]       Fax: [phone]   RxID:   [id number] DOXYCYCLINE HYCLATE 100 MG CAPS (DOXYCYCLINE HYCLATE) Take 1 tablet by mouth two times a day  #40 x 1   Entered and Authorized by:   Roderick Pee MD   Signed by:   Roderick Pee MD on 07/26/2009   Method used:   Electronically to        [facility]  [phone]* (retail)       [address]       [place], [place]  [address]       Ph: [phone] or [phone]       Fax: [phone]   RxID:   [id number]

## 2010-02-05 NOTE — Assessment & Plan Note (Signed)
Summary: fup per dr//ccm/pt rescd//ccm   Vital Signs:  Patient profile:   55 year old female Menstrual status:  postmenopausal BP sitting:   120 / 80  (left arm) Cuff size:   regular  Vitals Entered By: Kern Reap CMA Duncan Dull) (June 07, 2009 3:13 PM) CC: follow-up visit, drainage and cough   CC:  follow-up visit and drainage and cough.  History of Present Illness: Teagan is a 55 year old female comes in today for evaluation of an abscess in her left groin.  She's been treating this with hot soaks and antibiotics.  She is on doxy and Septra.  Is completely healed except for his hard lump there.  She's also had two days history of sore throat, head congestion, nonproductive cough, with no fever  Allergies: No Known Drug Allergies  Review of Systems      See HPI  Physical Exam  General:  Well-developed,well-nourished,in no acute distress; alert,appropriate and cooperative throughout examination Head:  Normocephalic and atraumatic without obvious abnormalities. No apparent alopecia or balding. Eyes:  No corneal or conjunctival inflammation noted. EOMI. Perrla. Funduscopic exam benign, without hemorrhages, exudates or papilledema. Vision grossly normal. Ears:  External ear exam shows no significant lesions or deformities.  Otoscopic examination reveals clear canals, tympanic membranes are intact bilaterally without bulging, retraction, inflammation or discharge. Hearing is grossly normal bilaterally. Nose:  External nasal examination shows no deformity or inflammation. Nasal mucosa are pink and moist without lesions or exudates. Mouth:  Oral mucosa and oropharynx without lesions or exudates.  Teeth in good repair. Neck:  No deformities, masses, or tenderness noted. Lungs:  Normal respiratory effort, chest expands symmetrically. Lungs are clear to auscultation, no crackles or wheezes. Skin:  the abscess in the left groin is well-healed.  There is a persistent 5 mm x 5 mm, hard  lesion   Problems:  Medical Problems Added: 1)  Dx of Viral Infection-unspec  (ICD-079.99)  Impression & Recommendations:  Problem # 1:  VIRAL INFECTION-UNSPEC (ICD-079.99) Assessment New  Problem # 2:  CELLULITIS AND ABSCESS OF TRUNK (ICD-682.2) Assessment: Improved  Her updated medication list for this problem includes:    Septra Ds 800-160 Mg Tabs (Sulfamethoxazole-trimethoprim) .Marland Kitchen... Take 1 tablet by mouth two times a day  Complete Medication List: 1)  Ativan 0.5 Mg Tabs (Lorazepam) .Marland Kitchen.. 1 tab @ bedtime as needed 2)  Vicodin 5-500 Mg Tabs (Hydrocodone-acetaminophen) .Marland Kitchen.. 1 q 4 hours as needed pain 3)  Septra Ds 800-160 Mg Tabs (Sulfamethoxazole-trimethoprim) .... Take 1 tablet by mouth two times a day  Patient Instructions: 1)  return p.r.n.

## 2010-02-05 NOTE — Progress Notes (Signed)
Summary: name of allergy  Phone Note Call from Patient Call back at Home Phone [phone]   Caller: vm Summary of Call: Dr. Karie Schwalbe mentioned an allergy of some sort.  What is it?  She wants to look it up.  Initial call taken by: Rudy Jew, RN,  October 01, 2009 2:30 PM  Follow-up for Phone Call        Fleet Contras please call one not sure, what she's talking about Follow-up by: Roderick Pee MD,  October 01, 2009 4:10 PM  Additional Follow-up for Phone Call Additional follow up Details #1::        Phone Call Completed Additional Follow-up by: Kern Reap CMA Duncan Dull),  October 02, 2009 5:13 PM

## 2011-02-17 ENCOUNTER — Other Ambulatory Visit (HOSPITAL_COMMUNITY)
Admission: RE | Admit: 2011-02-17 | Discharge: 2011-02-17 | Disposition: A | Discharge status: Home / Self Care | Payer: No Typology Code available for payment source | Source: Ambulatory Visit | Attending: Gynecology | Admitting: Gynecology

## 2011-02-17 ENCOUNTER — Telehealth: Payer: Self-pay | Admitting: *Deleted

## 2011-02-17 ENCOUNTER — Encounter: Payer: Self-pay | Admitting: Gynecology

## 2011-02-17 ENCOUNTER — Ambulatory Visit (INDEPENDENT_AMBULATORY_CARE_PROVIDER_SITE_OTHER): Payer: No Typology Code available for payment source | Admitting: Gynecology

## 2011-02-17 DIAGNOSIS — F411 Generalized anxiety disorder: Secondary | ICD-10-CM

## 2011-02-17 DIAGNOSIS — N760 Acute vaginitis: Secondary | ICD-10-CM

## 2011-02-17 DIAGNOSIS — G4700 Insomnia, unspecified: Secondary | ICD-10-CM

## 2011-02-17 DIAGNOSIS — F329 Major depressive disorder, single episode, unspecified: Secondary | ICD-10-CM

## 2011-02-17 DIAGNOSIS — A499 Bacterial infection, unspecified: Secondary | ICD-10-CM

## 2011-02-17 DIAGNOSIS — Z01419 Encounter for gynecological examination (general) (routine) without abnormal findings: Secondary | ICD-10-CM | POA: Insufficient documentation

## 2011-02-17 DIAGNOSIS — N898 Other specified noninflammatory disorders of vagina: Secondary | ICD-10-CM

## 2011-02-17 DIAGNOSIS — Z1211 Encounter for screening for malignant neoplasm of colon: Secondary | ICD-10-CM

## 2011-02-17 DIAGNOSIS — F419 Anxiety disorder, unspecified: Secondary | ICD-10-CM

## 2011-02-17 DIAGNOSIS — N949 Unspecified condition associated with female genital organs and menstrual cycle: Secondary | ICD-10-CM

## 2011-02-17 DIAGNOSIS — N951 Menopausal and female climacteric states: Secondary | ICD-10-CM

## 2011-02-17 DIAGNOSIS — B9689 Other specified bacterial agents as the cause of diseases classified elsewhere: Secondary | ICD-10-CM

## 2011-02-17 DIAGNOSIS — R635 Abnormal weight gain: Secondary | ICD-10-CM

## 2011-02-17 LAB — WET PREP FOR TRICH, YEAST, CLUE
Clue Cells Wet Prep HPF POC: NONE SEEN
Trich, Wet Prep: NONE SEEN
Yeast Wet Prep HPF POC: NONE SEEN

## 2011-02-17 LAB — URINALYSIS W MICROSCOPIC + REFLEX CULTURE
Bilirubin Urine: NEGATIVE
Casts: NONE SEEN
Crystals: NONE SEEN
Glucose, UA: NEGATIVE mg/dL
Ketones, ur: NEGATIVE mg/dL
Leukocytes, UA: NEGATIVE
Nitrite: NEGATIVE
Protein, ur: NEGATIVE mg/dL
Specific Gravity, Urine: 1.02 (ref 1.005–1.030)
Urobilinogen, UA: NEGATIVE mg/dL (ref 0.0–1.0)
pH: 6 (ref 5.0–8.0)

## 2011-02-17 MED ORDER — CLINDAMYCIN PHOSPHATE 2 % VA CREA: 1.0000 | TOPICAL_CREAM | Freq: Every day | VAGINAL | Status: AC

## 2011-02-17 NOTE — Telephone Encounter (Signed)
Pt called after speaking to insurance regarding coverage for bone density and the charge for it. Pt states will be covered 100% if a screenign but will have to pay if there is any other diagnosis. I asked our front desk and the charge is $237.

## 2011-02-17 NOTE — Patient Instructions (Addendum)
Menopause Menopause is the normal time of life when menstrual periods stop completely. Menopause is complete when you have missed 12 consecutive menstrual periods. It usually occurs between the ages of 48 to 55, with an average age of 51. Very rarely does a woman develop menopause before 58 years old. At menopause, your ovaries stop producing the female hormones, estrogen and progesterone. This can cause undesirable symptoms and also affect your health. Sometimes the symptoms may occur 4 to 5 years before the menopause begins. There is no relationship between menopause and:  Oral contraceptives.   Number of children you had.   Race.   The age your menstrual periods started (menarche).  Heavy smokers and very thin women may develop menopause earlier in life. CAUSES  The ovaries stop producing the female hormones estrogen and progesterone.   Other causes include:   Surgery to remove both ovaries.   The ovaries stop functioning for no known reason.   Tumors of the pituitary gland in the brain.   Medical disease that affects the ovaries and hormone production.   Radiation treatment to the abdomen or pelvis.   Chemotherapy that affects the ovaries.  SYMPTOMS   Hot flashes.   Night sweats.   Decrease in sex drive.   Vaginal dryness and thinning of the vagina causing painful intercourse.   Dryness of the skin and developing wrinkles.   Headaches.   Tiredness.   Irritability.   Memory problems.   Weight gain.   Bladder infections.   Hair growth of the face and chest.   Infertility.  More serious symptoms include:  Loss of bone (osteoporosis) causing breaks (fractures).   Depression.   Hardening and narrowing of the arteries (atherosclerosis) causing heart attacks and strokes.  DIAGNOSIS   When the menstrual periods have stopped for 12 straight months.   Physical exam.   Hormone studies of the blood.  TREATMENT  There are many treatment choices and nearly  as many questions about them. The decisions to treat or not to treat menopausal changes is an individual choice made with your caregiver. Your caregiver can discuss the treatments with you. Together, you can decide which treatment will work best for you. Your treatment choices may include:   Hormone therapy (estorgen and progesterone).   Non-hormonal medications.   Treating the individual symptoms with medication (for example antidepressants for depression).   Herbal medications that may help specific symptoms.   Counseling by a psychiatrist or psychologist.   Group therapy.   Lifestyle changes including:   Eating healthy.   Regular exercise.   Limiting caffeine and alcohol.   Stress management and meditation.   No treatment.  HOME CARE INSTRUCTIONS   Take the medication your caregiver gives you as directed.   Get plenty of sleep and rest.   Exercise regularly.   Eat a diet that contains calcium (good for the bones) and soy products (acts like estrogen hormone).   Avoid alcoholic beverages.   Do not smoke.   If you have hot flashes, dress in layers.   Take supplements, calcium and vitamin D to strengthen bones.   You can use over-the-counter lubricants or moisturizers for vaginal dryness.   Group therapy is sometimes very helpful.   Acupuncture may be helpful in some cases.  SEEK MEDICAL CARE IF:   You are not sure you are in menopause.   You are having menopausal symptoms and need advice and treatment.   You are still having menstrual periods after age 55.     You have pain with intercourse.   Menopause is complete (no menstrual period for 12 months) and you develop vaginal bleeding.   You need a referral to a specialist (gynecologist, psychiatrist or psychologist) for treatment.  SEEK IMMEDIATE MEDICAL CARE IF:   You have severe depression.   You have excessive vaginal bleeding.   You fell and think you have a broken bone.   You have pain when you  urinate.   You develop leg or chest pain.   You have a fast pounding heart beat (palpitations).   You have severe headaches.   You develop vision problems.   You feel a lump in your breast.   You have abdominal pain or severe indigestion.  Document Released: 03/15/2003 Document Revised: 09/04/2010 Document Reviewed: 10/21/2007 [facility] Patient Information 2012 [place], [place].   NAMS   [employer] Menopausal Society website  Bacterial Vaginosis Bacterial vaginosis (BV) is a vaginal infection where the normal balance of bacteria in the vagina is disrupted. The normal balance is then replaced by an overgrowth of certain bacteria. There are several different kinds of bacteria that can cause BV. BV is the most common vaginal infection in women of childbearing age. CAUSES   The cause of BV is not fully understood. BV develops when there is an increase or imbalance of harmful bacteria.   Some activities or behaviors can upset the normal balance of bacteria in the vagina and put women at increased risk including:   Having a new sex partner or multiple sex partners.   Douching.   Using an intrauterine device (IUD) for contraception.   It is not clear what role sexual activity plays in the development of BV. However, women that have never had sexual intercourse are rarely infected with BV.  Women do not get BV from toilet seats, bedding, swimming pools or from touching objects around them.  SYMPTOMS   Grey vaginal discharge.   A fish-like odor with discharge, especially after sexual intercourse.   Itching or burning of the vagina and vulva.   Burning or pain with urination.   Some women have no signs or symptoms at all.  DIAGNOSIS  Your caregiver must examine the vagina for signs of BV. Your caregiver will perform lab tests and look at the sample of vaginal fluid through a microscope. They will look for bacteria and abnormal cells (clue cells), a pH test higher than 4.5, and  a positive amine test all associated with BV.  RISKS AND COMPLICATIONS   Pelvic inflammatory disease (PID).   Infections following gynecology surgery.   Developing HIV.   Developing herpes virus.  TREATMENT  Sometimes BV will clear up without treatment. However, all women with symptoms of BV should be treated to avoid complications, especially if gynecology surgery is planned. Female partners generally do not need to be treated. However, BV may spread between female sex partners so treatment is helpful in preventing a recurrence of BV.   BV may be treated with antibiotics. The antibiotics come in either pill or vaginal cream forms. Either can be used with nonpregnant or pregnant women, but the recommended dosages differ. These antibiotics are not harmful to the baby.   BV can recur after treatment. If this happens, a second round of antibiotics will often be prescribed.   Treatment is important for pregnant women. If not treated, BV can cause a premature delivery, especially for a pregnant woman who had a premature birth in the past. All pregnant women who have symptoms of  BV should be checked and treated.   For chronic reoccurrence of BV, treatment with a type of prescribed gel vaginally twice a week is helpful.  HOME CARE INSTRUCTIONS   Finish all medication as directed by your caregiver.   Do not have sex until treatment is completed.   Tell your sexual partner that you have a vaginal infection. They should see their caregiver and be treated if they have problems, such as a mild rash or itching.   Practice safe sex. Use condoms. Only have 1 sex partner.  PREVENTION  Basic prevention steps can help reduce the risk of upsetting the natural balance of bacteria in the vagina and developing BV:  Do not have sexual intercourse (be abstinent).   Do not douche.   Use all of the medicine prescribed for treatment of BV, even if the signs and symptoms go away.   Tell your sex partner if  you have BV. That way, they can be treated, if needed, to prevent reoccurrence.  SEEK MEDICAL CARE IF:   Your symptoms are not improving after 3 days of treatment.   You have increased discharge, pain, or fever.  MAKE SURE YOU:   Understand these instructions.   Will watch your condition.   Will get help right away if you are not doing well or get worse.  FOR MORE INFORMATION  Division of STD Prevention (DSTDP), Centers for Disease Control and Prevention: [employer] American Social Health Association (ASHA): www.ashastd.org  Document Released: 12/23/2004 Document Revised: 09/04/2010 Document Reviewed: 06/15/2008 [facility] Patient Information 2012 [place], [place].

## 2011-02-17 NOTE — Progress Notes (Signed)
Melissa Welch [birth year] [id number]   History:    56 y.o.  for annual exam who has not been seen the office since 2006. Patient has been seen by Dr. Dub Mikes psychiatrist recently who has been treating her for her depression and anxiety in approximately 2 weeks ago had started her on Wellbutrin XL 150 mg daily. She is postmenopausal and into the menopause at the age of 79 and has had issues such as hot flashes irritability mood swings and vaginal dryness. She has never been on hormone replacement therapy and does not like to take medications. Dr. Dub Mikes had also placed on Klonopin which she takes 1 mg at bedtime when necessary as well. Patient has not had a mammogram in over 5 years and her last colonoscopy was benign in 2006. She has always had normal Pap smears in the past.  Past medical history,surgical history, family history and social history were all reviewed and documented in the EPIC chart.  Gynecologic History No LMP recorded. Patient is postmenopausal. Contraception: none Last Pap: Greater than 5 years ago. Results were: normal Last mammogram: Greater than 5 years ago. Results were: normal  Obstetric History OB History    Grav Para Term Preterm Abortions TAB SAB Ect Mult Living   2 2 2       2      # Outc Date GA Lbr Len/2nd Wgt Sex Del Anes PTL Lv   1 TRM     F CS  No Yes   2 TRM     M CS  No Yes       ROS:  Was performed and pertinent positives and negatives are included in the history.  Exam: chaperone present  BP 126/74  Ht 5' 2.25" (1.581 m)  Wt 138 lb (62.596 kg)  BMI 25.04 kg/m2  Body mass index is 25.04 kg/(m^2).  General appearance : Well developed well nourished female. No acute distress HEENT: Neck supple, trachea midline, no carotid bruits, no thyroidmegaly Lungs: Clear to auscultation, no rhonchi or wheezes, or rib retractions  Heart: Regular rate and rhythm, no murmurs or gallops Breast:Examined in sitting and supine position were symmetrical in appearance,  no palpable masses or tenderness,  no skin retraction, no nipple inversion, no nipple discharge, no skin discoloration, no axillary or supraclavicular lymphadenopathy Abdomen: no palpable masses or tenderness, no rebound or guarding Extremities: no edema or skin discoloration or tenderness  Pelvic:  Bartholin, Urethra, Skene Glands: Within normal limits             Vagina: No gross lesions or discharge  Cervix: No gross lesions or discharge  Uterus  axial, normal size, shape and consistency, non-tender and mobile  Adnexa  Without masses or tenderness  Anus and perineum  normal   Rectovaginal  normal sphincter tone without palpated masses or tenderness             Hemoccult obtained results pending at time of this dictation     Assessment/Plan:  56 y.o. female for annual exam with vasomotor symptoms attributed to the menopause. Although she has complained of weight gain and is also been treated for depression, we'll proceed with checking her TSH. She does have a strong family history of diabetes and will be checked as part of her comprehensive metabolic panel. We'll be checking also her CBC and a serum estradiol level since we do not do saliva test. A fasting lipid profile was ordered as well. She will also schedule a bone density study in the  near future. A requisition for mammogram to be scheduled was provided as well. Fecal occult blood testing was done today results pending at time of this dictation. The above labs will be drawn in a fasting state later this week. I did speak with Dr. Dub Mikes after the patient left , in reference to treating her depression and at the same time her vasomotor symptoms with a single agent instead of the Wellbutrin that Effexor or Pretiq has a better profile. She scheduled to see him tomorrow and he is going to transition her to the Effexor. If she continues with the vasomotor symptoms after 3-4 months she'll return back to the office and we may try low-dose hormone  replacement therapy for which literature information was provided. We also had a lengthy discussion on the women's health initiative study on the risks benefits and pros and cons of hormone replacement therapy. We discussed importance of calcium and vitamin D for osteoporosis prevention and weightbearing exercise as well. Her Pap smear was done today. New screening guidelines with Pap smears every 3 years was discussed. All questions were answered and we'll follow accordingly.  Ok Edwards MD, 3:36 PM 02/17/2011

## 2011-02-17 NOTE — Progress Notes (Signed)
Patient had a wet prep because of a slight vaginal discharge. The wet prep demonstrated significant amount of bacteria suspicious for bacterial vaginosis for which she'll be treated with Cleocin vaginal cream to apply each bedtime for 5 days.

## 2011-02-18 ENCOUNTER — Other Ambulatory Visit: Payer: Self-pay | Admitting: *Deleted

## 2011-02-18 DIAGNOSIS — E7800 Pure hypercholesterolemia, unspecified: Secondary | ICD-10-CM

## 2011-02-18 LAB — COMPREHENSIVE METABOLIC PANEL
AST: 25 U/L (ref 0–37)
Albumin: 4.5 g/dL (ref 3.5–5.2)
Alkaline Phosphatase: 81 U/L (ref 39–117)
BUN: 14 mg/dL (ref 6–23)
Glucose, Bld: 82 mg/dL (ref 70–99)
Potassium: 4.5 mEq/L (ref 3.5–5.3)
Sodium: 141 mEq/L (ref 135–145)
Total Bilirubin: 0.5 mg/dL (ref 0.3–1.2)

## 2011-02-18 LAB — CBC WITH DIFFERENTIAL/PLATELET
Basophils Absolute: 0 10*3/uL (ref 0.0–0.1)
Basophils Relative: 0 % (ref 0–1)
Eosinophils Relative: 2 % (ref 0–5)
Lymphocytes Relative: 34 % (ref 12–46)
MCHC: 33.3 g/dL (ref 30.0–36.0)
Neutro Abs: 3.3 10*3/uL (ref 1.7–7.7)
Platelets: 254 10*3/uL (ref 150–400)
RDW: 13.3 % (ref 11.5–15.5)
WBC: 6.1 10*3/uL (ref 4.0–10.5)

## 2011-02-18 LAB — LIPID PANEL
HDL: 51 mg/dL (ref >39)
LDL Cholesterol: 140 mg/dL — ABNORMAL HIGH (ref 0–99)
Total CHOL/HDL Ratio: 4.5 Ratio
Triglycerides: 200 mg/dL — ABNORMAL HIGH (ref <150)
VLDL: 40 mg/dL (ref 0–40)

## 2011-02-18 LAB — ESTRADIOL, FREE

## 2011-02-19 ENCOUNTER — Other Ambulatory Visit: Payer: No Typology Code available for payment source

## 2011-02-20 ENCOUNTER — Other Ambulatory Visit: Payer: No Typology Code available for payment source

## 2011-02-21 ENCOUNTER — Other Ambulatory Visit: Payer: Self-pay | Admitting: *Deleted

## 2011-02-21 DIAGNOSIS — E7800 Pure hypercholesterolemia, unspecified: Secondary | ICD-10-CM

## 2011-02-21 LAB — LIPID PANEL
HDL: 52 mg/dL (ref >39)
LDL Cholesterol: 163 mg/dL — ABNORMAL HIGH (ref 0–99)
Triglycerides: 103 mg/dL (ref <150)

## 2011-03-05 ENCOUNTER — Other Ambulatory Visit: Payer: No Typology Code available for payment source

## 2011-03-05 ENCOUNTER — Ambulatory Visit (INDEPENDENT_AMBULATORY_CARE_PROVIDER_SITE_OTHER): Payer: No Typology Code available for payment source

## 2011-03-05 DIAGNOSIS — M949 Disorder of cartilage, unspecified: Secondary | ICD-10-CM

## 2011-03-05 DIAGNOSIS — E7800 Pure hypercholesterolemia, unspecified: Secondary | ICD-10-CM

## 2011-03-05 DIAGNOSIS — N951 Menopausal and female climacteric states: Secondary | ICD-10-CM

## 2011-03-05 DIAGNOSIS — M899 Disorder of bone, unspecified: Secondary | ICD-10-CM

## 2011-03-05 LAB — COMPREHENSIVE METABOLIC PANEL
AST: 26 U/L (ref 0–37)
BUN: 15 mg/dL (ref 6–23)
CO2: 23 mEq/L (ref 19–32)
Calcium: 9.5 mg/dL (ref 8.4–10.5)
Chloride: 107 mEq/L (ref 96–112)
Creat: 0.88 mg/dL (ref 0.50–1.10)

## 2011-03-18 ENCOUNTER — Other Ambulatory Visit: Payer: Self-pay | Admitting: Gynecology

## 2011-03-18 DIAGNOSIS — Z1231 Encounter for screening mammogram for malignant neoplasm of breast: Secondary | ICD-10-CM

## 2011-03-28 ENCOUNTER — Ambulatory Visit
Admission: RE | Admit: 2011-03-28 | Discharge: 2011-03-28 | Disposition: A | Discharge status: Home / Self Care | Payer: No Typology Code available for payment source | Source: Ambulatory Visit | Attending: Gynecology | Admitting: Gynecology

## 2011-03-28 DIAGNOSIS — Z1231 Encounter for screening mammogram for malignant neoplasm of breast: Secondary | ICD-10-CM

## 2011-03-28 IMAGING — MG MM SCREEN MAMMOGRAM BILATERAL
4 series · 4 of 4 positions shown · non-contrast
Comparison: none

[R CC]
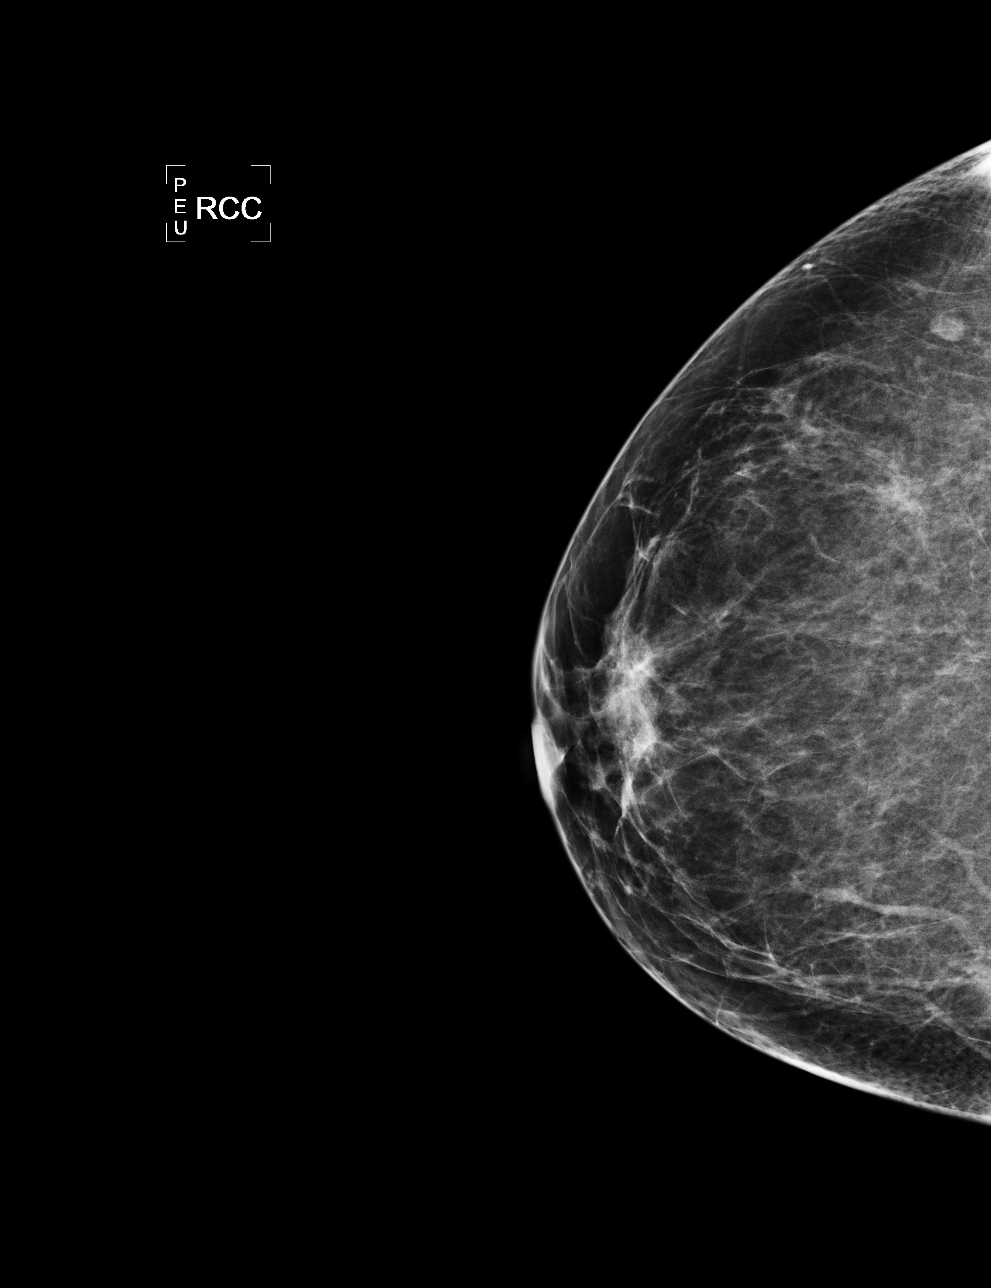

[L CC]
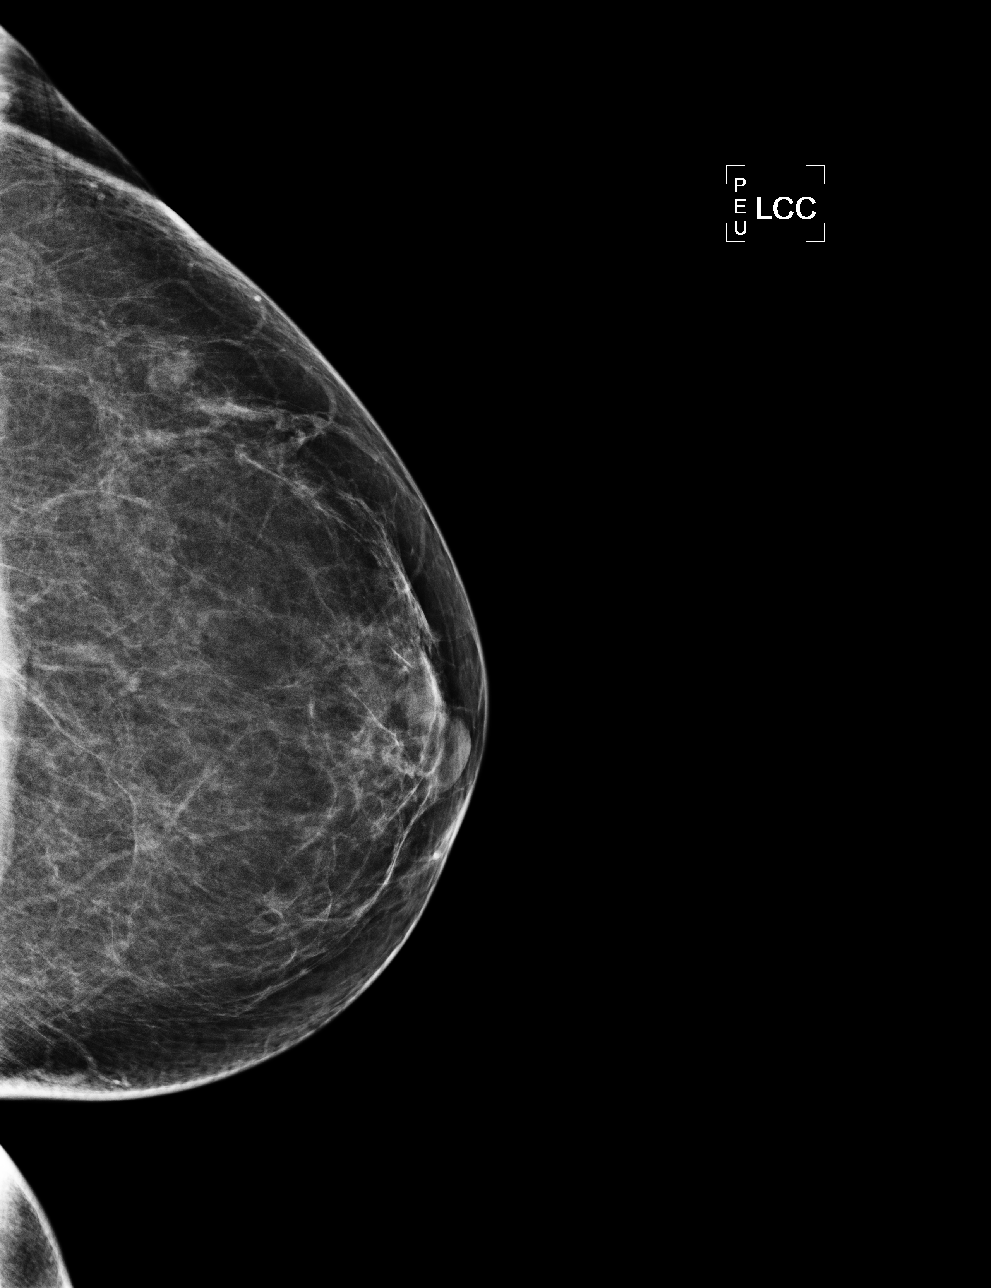

[L MLO]
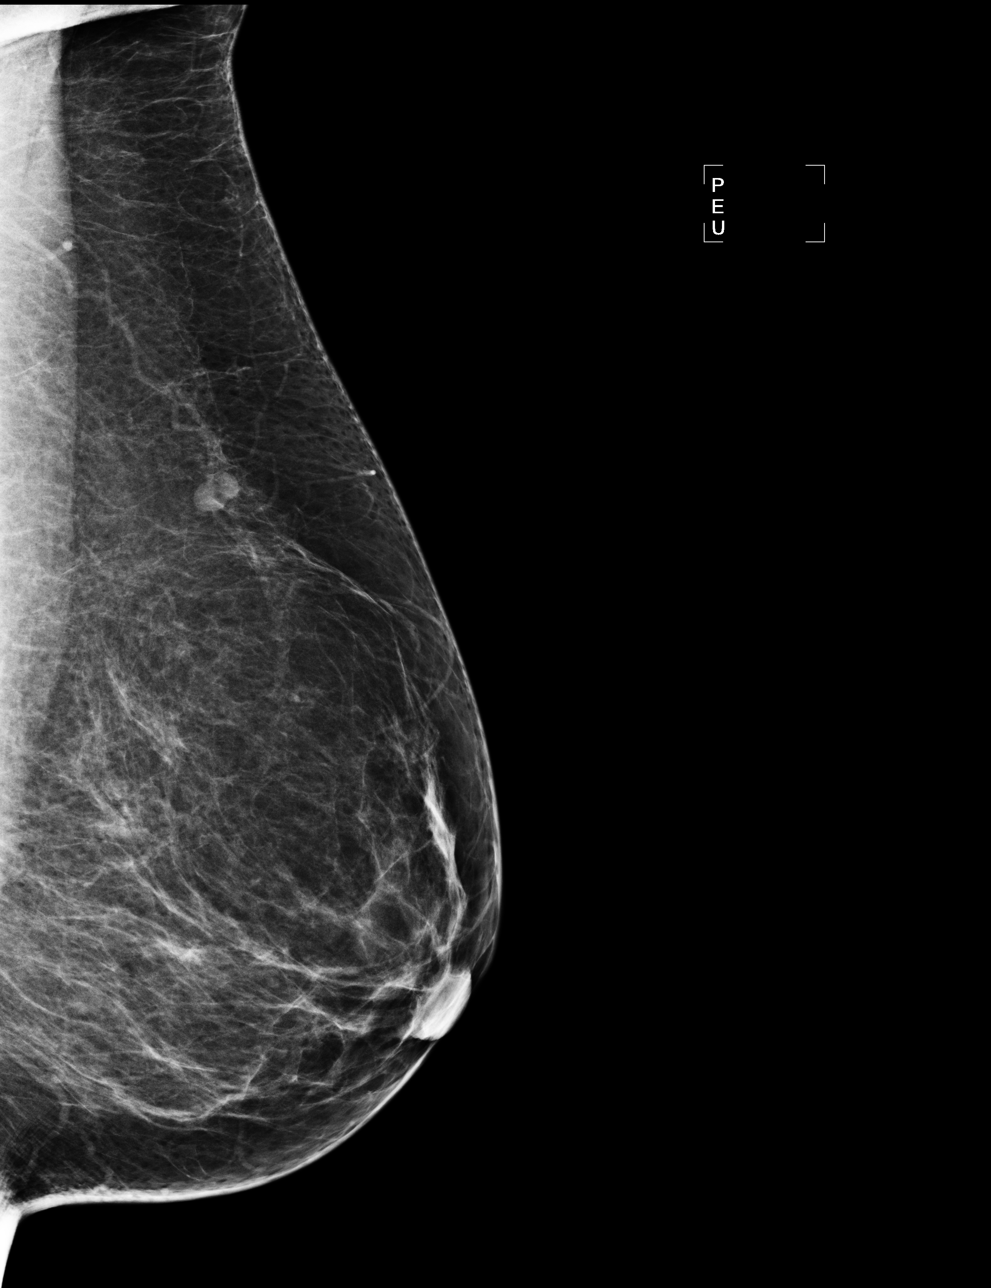

[R MLO]
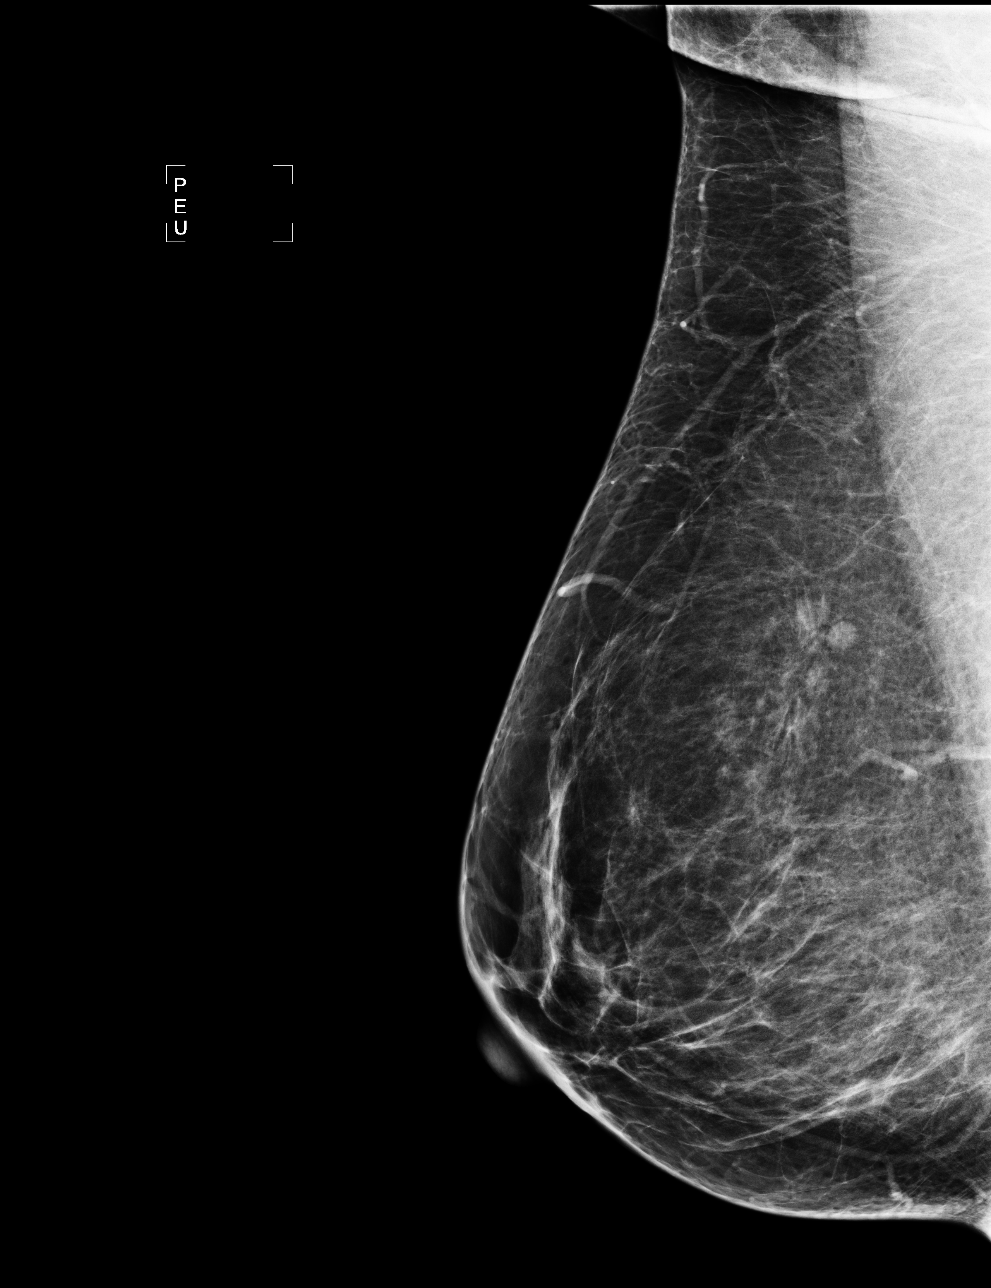

[4 of 4 positions shown; findings below may reference images not displayed]

Canned report from images found in remote index.

Refer to host system for actual result text.

## 2011-04-07 ENCOUNTER — Other Ambulatory Visit: Payer: Self-pay | Admitting: *Deleted

## 2011-04-07 DIAGNOSIS — N63 Unspecified lump in unspecified breast: Secondary | ICD-10-CM

## 2011-04-11 ENCOUNTER — Ambulatory Visit
Admission: RE | Admit: 2011-04-11 | Discharge: 2011-04-11 | Disposition: A | Discharge status: Home / Self Care | Payer: No Typology Code available for payment source | Source: Ambulatory Visit | Attending: Gynecology | Admitting: Gynecology

## 2011-04-11 DIAGNOSIS — N63 Unspecified lump in unspecified breast: Secondary | ICD-10-CM

## 2011-04-11 IMAGING — MG MM DIGITAL DIAGNOSTIC LIMITED*R*
2 series · 2 of 2 positions shown · non-contrast
Comparison: [DATE]

CLINICAL DATA: Abnormal screening, right breast

DIGITAL DIAGNOSTIC RIGHT MAMMOGRAM WITHOUT CAD AND RIGHT BREAST
ULTRASOUND:

[R CC]
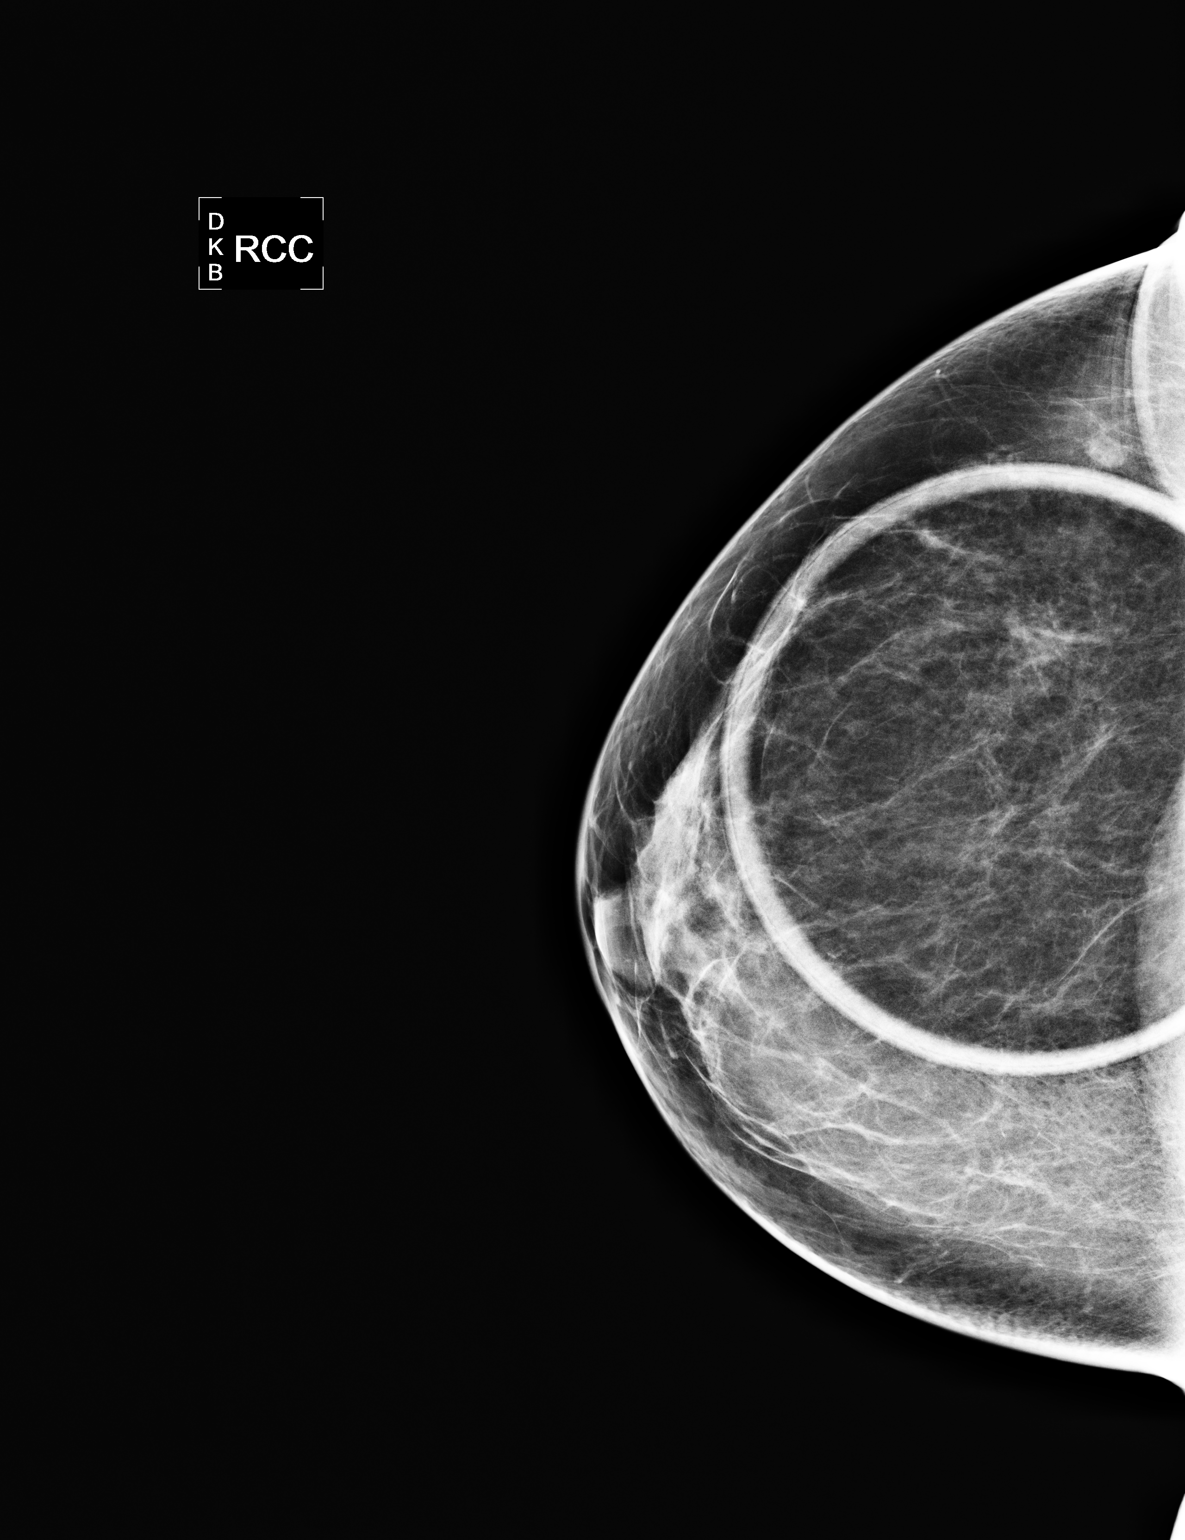

[R MLO]
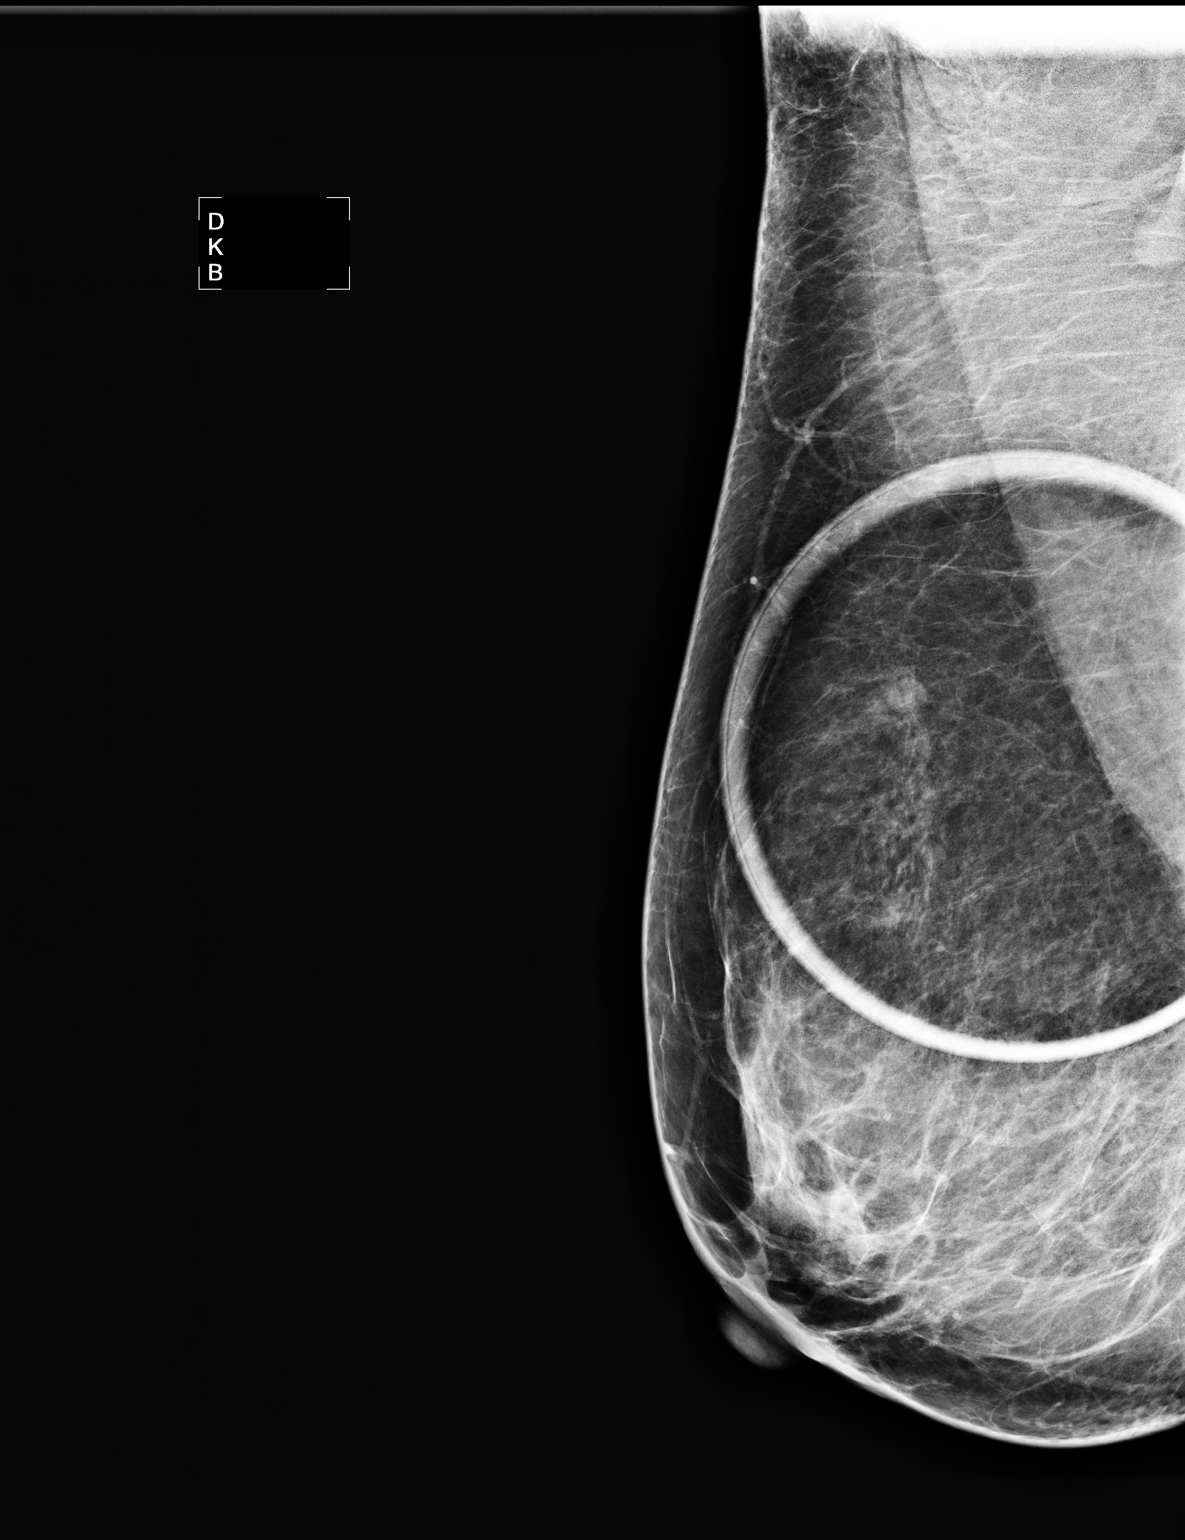

[2 of 2 positions shown; findings below may reference images not displayed]

FINDINGS: Spot compression views in the upper-outer quadrant of
the right breast, posteriorly demonstrate normal tissue without
persistent mass or distortion.  Compared to priors.

On physical exam, I palpate normal tissue in the upper-outer
quadrant of the right breast.

Ultrasound is performed, showing normal tissue in the upper-outer
quadrant of the right breast.
IMPRESSION: Normal ultrasound, right breast as detailed above.  Recommend
screening mammography in 1 year.

BI-RADS CATEGORY 1:  Negative.

## 2011-08-14 ENCOUNTER — Ambulatory Visit (INDEPENDENT_AMBULATORY_CARE_PROVIDER_SITE_OTHER): Payer: No Typology Code available for payment source | Admitting: Family

## 2011-08-14 ENCOUNTER — Encounter: Payer: Self-pay | Admitting: Family

## 2011-08-14 ENCOUNTER — Telehealth: Payer: Self-pay | Admitting: Family Medicine

## 2011-08-14 VITALS — BP 100/70 | HR 81 | Temp 98.3°F | Wt 141.0 lb

## 2011-08-14 DIAGNOSIS — B029 Zoster without complications: Secondary | ICD-10-CM

## 2011-08-14 DIAGNOSIS — IMO0001 Reserved for inherently not codable concepts without codable children: Secondary | ICD-10-CM

## 2011-08-14 DIAGNOSIS — M7918 Myalgia, other site: Secondary | ICD-10-CM

## 2011-08-14 MED ORDER — VALACYCLOVIR HCL 1 G PO TABS: 1000.0000 mg | ORAL_TABLET | Freq: Three times a day (TID) | ORAL | Status: AC

## 2011-08-14 MED ORDER — ACYCLOVIR 5 % EX OINT: TOPICAL_OINTMENT | CUTANEOUS | Status: AC

## 2011-08-14 NOTE — Progress Notes (Signed)
  Subjective:    Patient ID: Melissa Welch, female    DOB: March 30, 1955, 56 y.o.   MRN: [id number]  HPI 56 year old female, patient of Dr. Tawanna Cooler is in today with complaints of a shingles outbreak on her left buttocks. The restaurant yesterday, is painful, burning and tingling. She's had 3 previous shingles outbreak earlier this year. Reports an increase in the amount of stress at work. 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 She currently takes Wellbutrin 150 mg once a day and it's working well.   Review of Systems  Constitutional: Negative.   Respiratory: Negative.   Cardiovascular: Negative.   Gastrointestinal: Negative.   Musculoskeletal: Negative.   Skin: Positive for rash.       Rash to the left gluteal fold. No drainage or discharge.  Hematological: Negative.   Psychiatric/Behavioral: Negative.    Past Medical History  Diagnosis Date  . Depression   . Insomnia     History   Social History  . Marital Status: Legally Separated    Spouse Name: N/A    Number of Children: N/A  . Years of Education: N/A   Occupational History  . Not on file.   Social History Main Topics  . Smoking status: Former Smoker    Quit date: 02/16/1989  . Smokeless tobacco: Never Used  . Alcohol Use: Yes     social  . Drug Use: No  . Sexually Active: Yes     patient's partner with vasectomy   Other Topics Concern  . Not on file   Social History Narrative  . No narrative on file    Past Surgical History  Procedure Date  . Cesarean section     x2    Family History  Problem Relation Age of Onset  . Diabetes Mother   . Cancer Maternal Aunt     ovarian  . Breast cancer Maternal Aunt   . Cancer Paternal Aunt     uterine  . Hypertension Maternal Grandmother   . Diabetes Maternal Grandfather   . Hypertension Maternal Grandfather   . Cancer Paternal Grandfather     colon    No Known Allergies  Current Outpatient Prescriptions on File Prior to Visit  Medication Sig  Dispense Refill  . buPROPion (WELLBUTRIN XL) 150 MG 24 hr tablet Take 150 mg by mouth daily.      . clonazePAM (KLONOPIN) 1 MG tablet Take 0.5 mg by mouth at bedtime.        BP 100/70  Pulse 81  Temp 98.3 F (36.8 C) (Oral)  Wt 141 lb (63.957 kg)  SpO2 97%chart    Objective:   Physical Exam  Constitutional: She is oriented to person, place, and time. She appears well-developed and well-nourished.  Cardiovascular: Normal rate, regular rhythm and normal heart sounds.   Pulmonary/Chest: Effort normal and breath sounds normal.  Neurological: She is alert and oriented to person, place, and time.  Skin: Skin is warm and dry. Rash noted.       Rash to the left buttocks. Tender and red to palpation.   Psychiatric: She has a normal mood and affect.          Assessment & Plan:  Assessment: Herpes zoster, pain buttocks  Plan: Valtrex 1 g 3 times a day x7 days. Zovirax ointment applied to the affected area when necessary. Patient call the office if symptoms worsen or persist. Recheck as scheduled and sooner when necessary.

## 2011-08-14 NOTE — Telephone Encounter (Signed)
Pt called and said that the acyclovir ointment (ZOVIRAX) 5 % ointment is $500 at [facility]. Is there a cheaper med?

## 2011-08-14 NOTE — Patient Instructions (Signed)

## 2011-08-14 NOTE — Telephone Encounter (Signed)
Pt should just use the valtrex

## 2011-08-15 NOTE — Telephone Encounter (Signed)
Called and lft vm for pt to use just use the Valtrex as noted.

## 2012-04-22 ENCOUNTER — Other Ambulatory Visit: Payer: Self-pay | Admitting: Family

## 2013-06-02 ENCOUNTER — Other Ambulatory Visit: Payer: Self-pay

## 2013-06-02 DIAGNOSIS — Z1231 Encounter for screening mammogram for malignant neoplasm of breast: Secondary | ICD-10-CM

## 2013-06-07 ENCOUNTER — Encounter (INDEPENDENT_AMBULATORY_CARE_PROVIDER_SITE_OTHER): Payer: Self-pay

## 2013-06-07 ENCOUNTER — Ambulatory Visit
Admission: RE | Admit: 2013-06-07 | Discharge: 2013-06-07 | Disposition: A | Discharge status: Home / Self Care | Payer: No Typology Code available for payment source | Source: Ambulatory Visit

## 2013-06-07 DIAGNOSIS — Z1231 Encounter for screening mammogram for malignant neoplasm of breast: Secondary | ICD-10-CM

## 2013-06-07 IMAGING — MG MM DIGITAL SCREENING BILAT W/ CAD
4 series · 4 of 4 positions shown · non-contrast
Comparison: Previous exam(s).

CLINICAL DATA: Screening.

EXAM:
DIGITAL SCREENING BILATERAL MAMMOGRAM WITH CAD

[R CC]
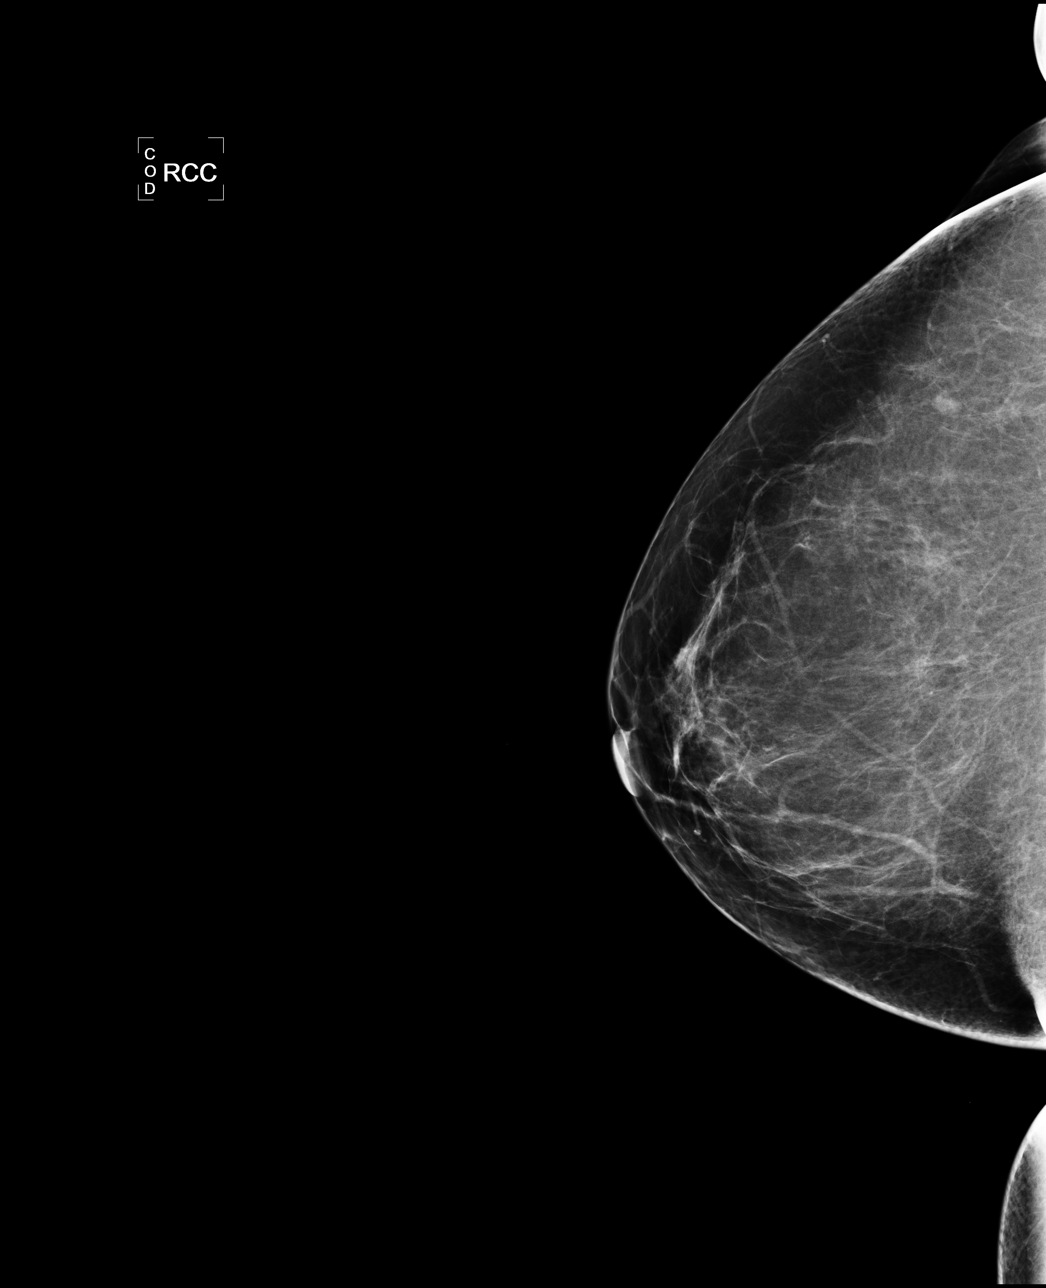

[L CC]
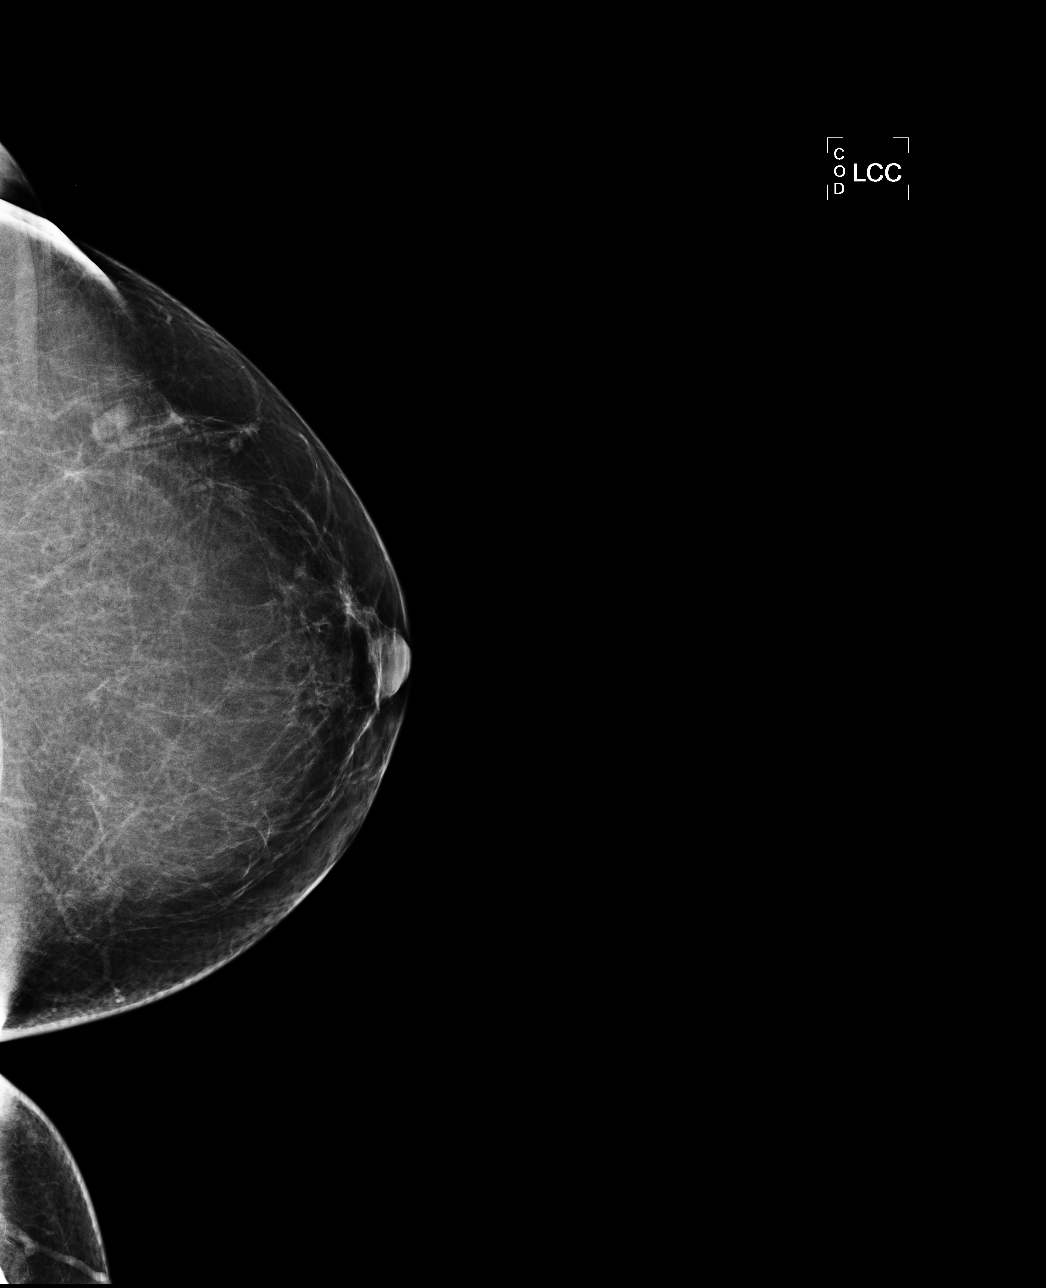

[L MLO]
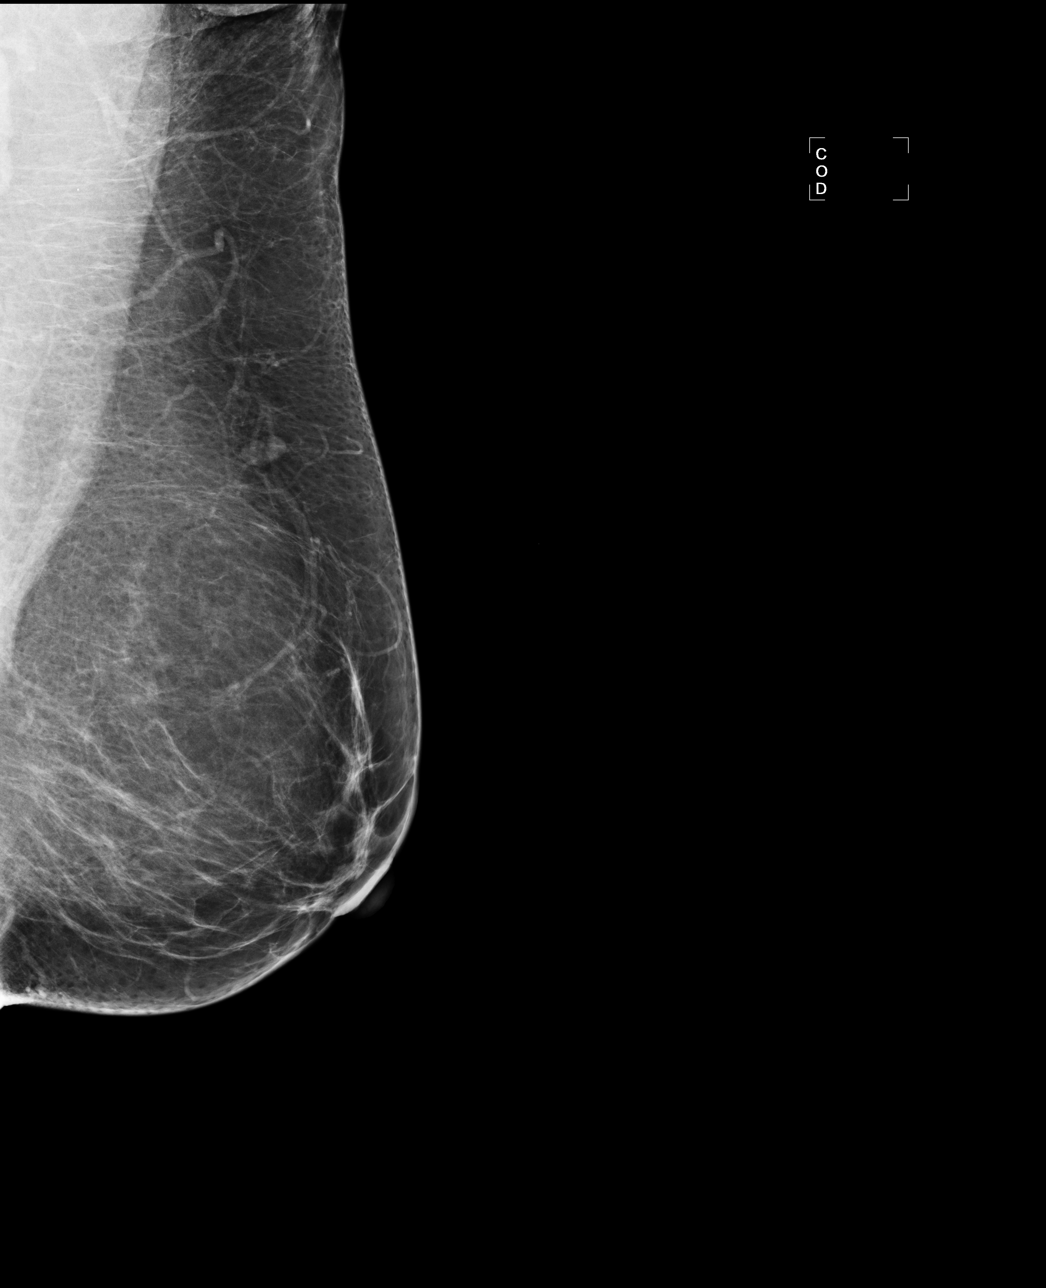

[R MLO]
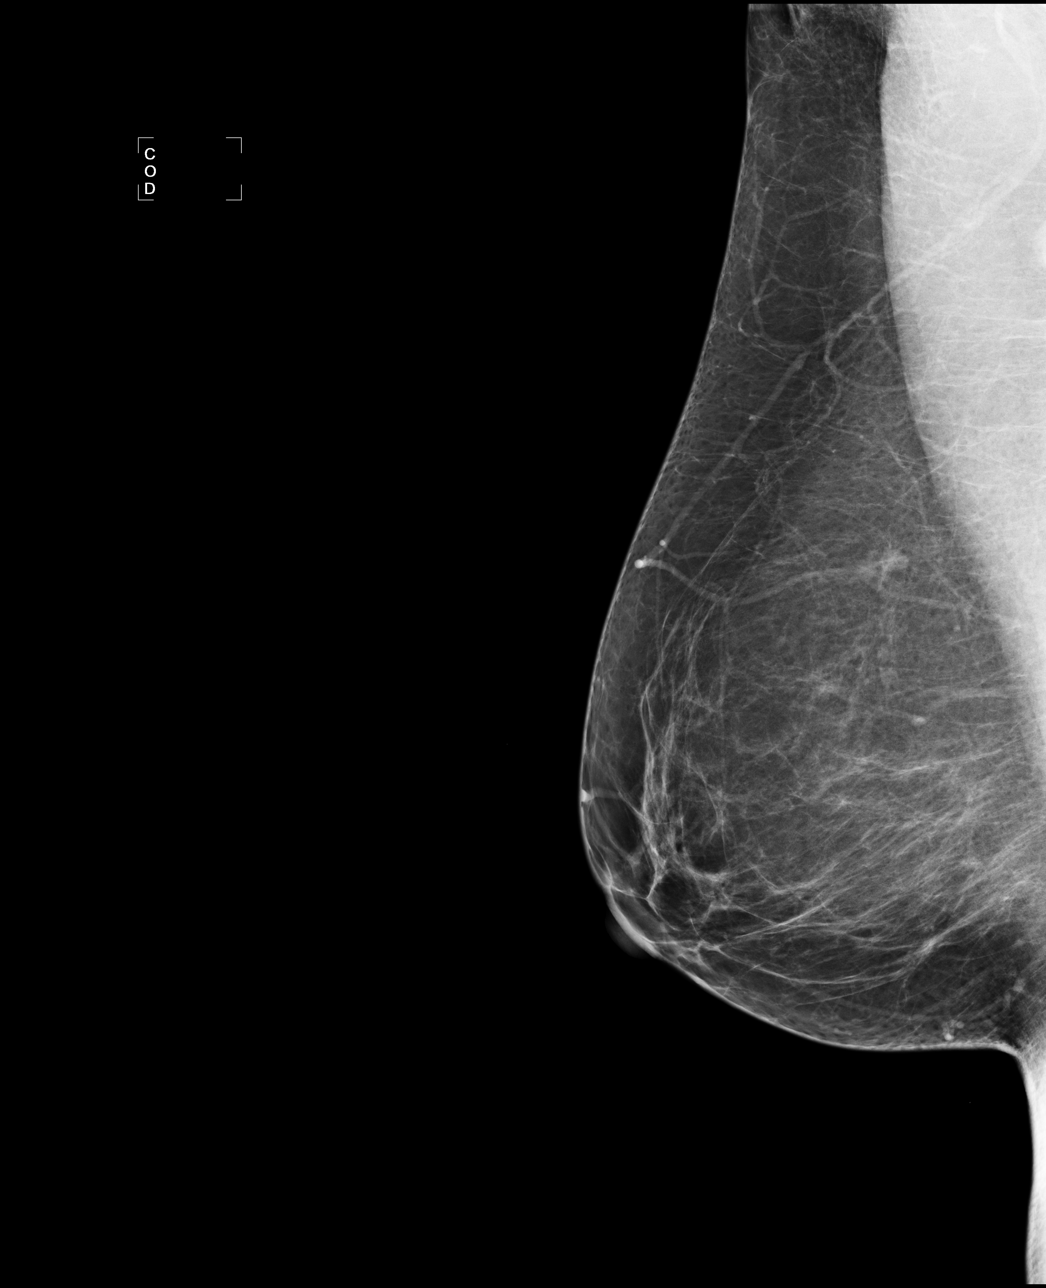

[4 of 4 positions shown; findings below may reference images not displayed]

ACR Breast Density Category b: There are scattered areas of
fibroglandular density.
FINDINGS: There are no findings suspicious for malignancy. Images were
processed with CAD.
IMPRESSION: No mammographic evidence of malignancy. A result letter of this
screening mammogram will be mailed directly to the patient.

RECOMMENDATION:
Screening mammogram in one year. (Code:[US])

BI-RADS CATEGORY  1: Negative.

## 2013-11-07 ENCOUNTER — Encounter: Payer: Self-pay | Admitting: Family

## 2014-05-15 ENCOUNTER — Encounter: Payer: Self-pay | Admitting: Internal Medicine

## 2014-05-16 ENCOUNTER — Encounter: Payer: Self-pay | Admitting: Internal Medicine

## 2014-09-21 ENCOUNTER — Encounter: Payer: Self-pay | Admitting: Internal Medicine

## 2019-03-09 DIAGNOSIS — F411 Generalized anxiety disorder: Secondary | ICD-10-CM | POA: Diagnosis not present

## 2019-03-09 DIAGNOSIS — G4700 Insomnia, unspecified: Secondary | ICD-10-CM | POA: Diagnosis not present

## 2019-03-09 DIAGNOSIS — R252 Cramp and spasm: Secondary | ICD-10-CM | POA: Diagnosis not present

## 2019-03-09 DIAGNOSIS — E782 Mixed hyperlipidemia: Secondary | ICD-10-CM | POA: Diagnosis not present

## 2019-03-09 DIAGNOSIS — F331 Major depressive disorder, recurrent, moderate: Secondary | ICD-10-CM | POA: Diagnosis not present

## 2019-05-24 DIAGNOSIS — M25562 Pain in left knee: Secondary | ICD-10-CM | POA: Diagnosis not present

## 2019-05-24 DIAGNOSIS — G8929 Other chronic pain: Secondary | ICD-10-CM | POA: Diagnosis not present

## 2019-05-24 DIAGNOSIS — M25561 Pain in right knee: Secondary | ICD-10-CM | POA: Diagnosis not present

## 2019-05-24 DIAGNOSIS — M170 Bilateral primary osteoarthritis of knee: Secondary | ICD-10-CM | POA: Diagnosis not present

## 2019-05-24 DIAGNOSIS — M546 Pain in thoracic spine: Secondary | ICD-10-CM | POA: Diagnosis not present

## 2019-07-05 DIAGNOSIS — M1711 Unilateral primary osteoarthritis, right knee: Secondary | ICD-10-CM | POA: Diagnosis not present

## 2019-07-13 ENCOUNTER — Ambulatory Visit: Payer: No Typology Code available for payment source | Admitting: Podiatry

## 2019-07-15 ENCOUNTER — Ambulatory Visit (INDEPENDENT_AMBULATORY_CARE_PROVIDER_SITE_OTHER): Payer: PPO | Admitting: Podiatry

## 2019-07-15 ENCOUNTER — Encounter: Payer: Self-pay | Admitting: Podiatry

## 2019-07-15 ENCOUNTER — Other Ambulatory Visit: Payer: Self-pay

## 2019-07-15 ENCOUNTER — Ambulatory Visit (INDEPENDENT_AMBULATORY_CARE_PROVIDER_SITE_OTHER): Payer: PPO

## 2019-07-15 DIAGNOSIS — M778 Other enthesopathies, not elsewhere classified: Secondary | ICD-10-CM

## 2019-07-15 DIAGNOSIS — M7750 Other enthesopathy of unspecified foot: Secondary | ICD-10-CM

## 2019-07-15 DIAGNOSIS — D3610 Benign neoplasm of peripheral nerves and autonomic nervous system, unspecified: Secondary | ICD-10-CM | POA: Diagnosis not present

## 2019-07-15 DIAGNOSIS — M779 Enthesopathy, unspecified: Secondary | ICD-10-CM

## 2019-07-15 NOTE — Patient Instructions (Signed)

## 2019-07-18 NOTE — Progress Notes (Signed)
Subjective:   Patient ID: Melissa Welch- Melissa Welch, female   DOB: 64 y.o.   MRN: [id number]   HPI Patient presents stating she has had some burning radiating pain in her right forefoot and states is been going on for a fairly long time.  Also gets problems with ambulation and states at times it is debilitating seems to hurt more when barefoot and with certain shoe gear patient does not smoke likes to be active   Review of Systems  All other systems reviewed and are negative.       Objective:  Physical Exam Vitals and nursing note reviewed.  Constitutional:      Appearance: She is well-developed.  Pulmonary:     Effort: Pulmonary effort is normal.  Musculoskeletal:        General: Normal range of motion.  Skin:    General: Skin is warm.  Neurological:     Mental Status: She is alert.     Neurovascular status intact muscle strength adequate range of motion within normal limits.  Patient is noted to have radiating discomfort third intermetatarsal space right with a positive Mulder sign and shooting pain into the adjacent digits and also appears to have some discomfort around the metatarsal phalangeal joint     Assessment:  Difficult to ascertain between possible neuroma symptomatology versus capsulitis     Plan:  H&P reviewed conditions and x-ray and today I did a sterile prep and injected the third interspace with a combination of Xylocaine Marcaine and 1 cc of dexamethasone Kenalog.  I want to see how this responds over the next 3 to 4 weeks and excision may be necessary and also I will watch the joints and advised rigid bottom shoes  X-rays indicate no signs of stress fracture or advanced arthritis associated with this condition

## 2019-10-11 ENCOUNTER — Other Ambulatory Visit: Payer: Self-pay

## 2019-10-11 ENCOUNTER — Ambulatory Visit: Payer: PPO | Admitting: Sports Medicine

## 2019-10-11 ENCOUNTER — Ambulatory Visit: Payer: Self-pay

## 2019-10-11 VITALS — BP 100/68 | Ht 62.0 in | Wt 138.0 lb

## 2019-10-11 DIAGNOSIS — G8929 Other chronic pain: Secondary | ICD-10-CM | POA: Diagnosis not present

## 2019-10-11 DIAGNOSIS — M1711 Unilateral primary osteoarthritis, right knee: Secondary | ICD-10-CM | POA: Insufficient documentation

## 2019-10-11 DIAGNOSIS — M25561 Pain in right knee: Secondary | ICD-10-CM | POA: Diagnosis not present

## 2019-10-11 NOTE — Patient Instructions (Addendum)
It was great to meet you today!  We discussed the following for your right knee pain: -Use the knee compression sleeve when up and walking around during the day -Ice 15 minutes at a time, especially after activity -Make sure you are wearing shoes with good arch support -Take Aleve as needed for pain -Continue taking supplements as you have been - add tart cherry juice -You can continue walking for exercise as long as it remains relatively pain free -Stationary bike is a good exercise for your knee pain as well -Do the exercises you were given today (straight leg raise, side leg raise, knee extension, isometric quad) -You were given information on PT Pilates

## 2019-10-11 NOTE — Progress Notes (Signed)
SUBJECTIVE:   CHIEF COMPLAINT / HPI: Referred by Dr. Milagros Evener  Right Knee Pain Melissa Welch is a very pleasant 64 year old female who is a new patient to this clinic who presents today for follow-up due to right knee pain.  She states she has been having a dull throbbing achy pain that gets worse with activity especially since she has started her moving process and been going up and down several flights of stairs per day since December.  She has been seeing other doctors for this problem and received 2 cortisone injections plus completed a Synvisc trial of 3 injections and has not given her any relief.  She has noticed that the right knee does swell a little bit but no skin changes or bruising.  She has no history of trauma fall or previous injury to the right knee.  She states she can remember no inciting event which made this happen.  The pain is mostly located on the inside of the knee around the medial border of the patella and wraps around to the side.  She also endorses the feeling like it clicks and pops especially when going up or down stairs and she does state that 1 instance where she sat down for a long period of time and went to get up she felt like it locked up on her.  No falls.  Note prior evaluation at [facility] showed medial compartment OA and PFOA bilateral with RT > LT  Left knee is functioning well at this time and not that painful  PERTINENT  PMH / PSH: Postmenopausal  OBJECTIVE:   BP 100/68   Ht 5\' 2"  (1.575 m)   Wt 138 lb (62.6 kg)   BMI 25.24 kg/m   [facility] 10/11/2019  Frequency of aerobic exercise (# of days/week) 0  Average time in minutes 0  Frequency of strengthening activities (# of days/week) 0   Knee, Right: Inspection was negative for erythema, ecchymosis, but positive for slight effusion. Signs of mild osteophyte development. Palpation yielded no asymmetric warmth; Positive for medial joint line tenderness;  Positive for patellar crepitus. Quad muscle on the right is small than the left. ROM normal with pain at 125 degrees of flexion and extension (0 degrees). Normal hamstring and quadriceps strength. Neurovascularly intact bilaterally.  Comparison of left knee - 145 deg Flexion and 0 deg extension with no pain  Special Tests  - Cruciate Ligaments:   - Anterior Drawer:  NEG - Posterior Drawer: NEG  - Collateral Ligaments:   - Varus/Valgus Stress test: NEG  - Patella:   - Patellar grind/compression: Neg  Limited MSK U/S Right Knee Significant findings for slight fluid collection at the medial joint line with bulging of the right medial meniscus.  No obvious tear seen. Meniscus pseudocyst change at midline with joint space narrowing Hypoechoic change also seen in the suprapatellar pouch more on the medial side than on the lateral side consistent with moderate effusion.  No crystals seen in fluid.   No fluid collection or tears of the lateral meniscus. Trochlear view did reveal less cartilage on the medial patellofemoral side.  Patient does have moderate-sized Baker's cyst.  Interpretation: Right knee joint effusion most likely due to OA.  Baker's cyst; Medial meniscus pseudocyst  Ultrasound and interpretation by Dr. Garlan Fillers and Wolfgang Phoenix. Fields, MD   ASSESSMENT/PLAN:   Right knee pain Patient has right knee pain due to medial compartment and patellofemoral osteoarthritis has been ongoing for now  10 months and has been refractory to interventions such as corticosteroid injections and gel injections. Her history, presentation, and physical exam all suggest moderate to severe OA.  Ultrasound findings today did suggest osteoarthritic changes as well and fluid buildup within the knee joint. -Patient supplied with knee compression sleeve to wear when active. -Given at home exercises including leg extensions, lateral leg lifts front leg lifts, and isometrics -Patient can take Tylenol and ibuprofen as  needed, she is also taking supplements such as turmeric which she can continue -We will follow up with her in 4 to 6 weeks to see if these conservative measures have helped at all. -At some point she will need most likely total knee replacement given the severity of her symptoms however we will try to get her functional as possible to allow her time to put off surgery     Melissa Alpha, DO PGY-4, Sports Medicine Fellow [facility]  I observed and examined the patient with the [facility] resident and agree with assessment and plan.  Note reviewed and modified by me. Melissa Mcgill, MD

## 2019-10-11 NOTE — Assessment & Plan Note (Addendum)
Patient has right knee pain due to medial compartment and patellofemoral osteoarthritis has been ongoing for now 10 months and has been refractory to interventions such as corticosteroid injections and gel injections. Her history, presentation, and physical exam all suggest moderate to severe OA.  Ultrasound findings today did suggest osteoarthritic changes as well and fluid buildup within the knee joint. -Patient supplied with knee compression sleeve to wear when active. -Given at home exercises including leg extensions, lateral leg lifts front leg lifts, and isometrics -Patient can take Tylenol and ibuprofen as needed, she is also taking supplements such as turmeric which she can continue -We will follow up with her in 4 to 6 weeks to see if these conservative measures have helped at all. -At some point she will need most likely total knee replacement given the severity of her symptoms however we will try to get her functional as possible to allow her time to put off surgery

## 2019-10-12 ENCOUNTER — Encounter: Payer: Self-pay | Admitting: Sports Medicine

## 2019-11-01 DIAGNOSIS — Z20822 Contact with and (suspected) exposure to covid-19: Secondary | ICD-10-CM | POA: Diagnosis not present

## 2019-11-11 ENCOUNTER — Other Ambulatory Visit: Payer: Self-pay | Admitting: Family Medicine

## 2019-11-11 MED ORDER — DICLOFENAC SODIUM 1 % EX GEL: 2.0000 g | Freq: Four times a day (QID) | CUTANEOUS | 0 refills | Status: AC

## 2019-11-15 DIAGNOSIS — Z20822 Contact with and (suspected) exposure to covid-19: Secondary | ICD-10-CM | POA: Diagnosis not present

## 2019-11-22 DIAGNOSIS — Z20822 Contact with and (suspected) exposure to covid-19: Secondary | ICD-10-CM | POA: Diagnosis not present

## 2019-11-29 DIAGNOSIS — Z20822 Contact with and (suspected) exposure to covid-19: Secondary | ICD-10-CM | POA: Diagnosis not present

## 2019-12-13 DIAGNOSIS — Z20822 Contact with and (suspected) exposure to covid-19: Secondary | ICD-10-CM | POA: Diagnosis not present

## 2019-12-20 DIAGNOSIS — Z20822 Contact with and (suspected) exposure to covid-19: Secondary | ICD-10-CM | POA: Diagnosis not present

## 2019-12-27 DIAGNOSIS — Z20822 Contact with and (suspected) exposure to covid-19: Secondary | ICD-10-CM | POA: Diagnosis not present

## 2019-12-29 ENCOUNTER — Other Ambulatory Visit: Payer: Self-pay

## 2019-12-29 ENCOUNTER — Ambulatory Visit: Payer: PPO | Admitting: Sports Medicine

## 2019-12-29 VITALS — BP 120/70 | Ht 63.0 in | Wt 140.0 lb

## 2019-12-29 DIAGNOSIS — G8929 Other chronic pain: Secondary | ICD-10-CM | POA: Diagnosis not present

## 2019-12-29 DIAGNOSIS — M1711 Unilateral primary osteoarthritis, right knee: Secondary | ICD-10-CM | POA: Diagnosis not present

## 2019-12-29 DIAGNOSIS — M25561 Pain in right knee: Secondary | ICD-10-CM | POA: Diagnosis not present

## 2019-12-29 MED ORDER — TRAMADOL HCL 50 MG PO TABS: ORAL_TABLET | ORAL | 0 refills | Status: AC

## 2019-12-29 NOTE — Progress Notes (Signed)
Chief complaint -chronic right knee pain  Patient has been evaluated at [facility] and by me for chronic right knee pain [facility] showed severe medial compartment grade 4 arthritis and mild patellofemoral arthritis Comparison x-ray of the left knee showed mild medial compartment arthritis and mild patellofemoral arthritis  Since her last visit she has been using Voltaren gel and a compression sleeve These are helpful and actually got her through a wedding  She has tried 2 injections of cortisone with the first 1 lasting for a few months The second injection only lasted a couple weeks and then the pain returned  She comes to asked me about other treatments in particular about flexogenics and about knee replacement  Review of systems Some nightly swelling of the right knee Tightness in the right knee More pain going down steps  Physical exam Pleasant female in no acute distress BP 120/70   Ht 5\' 3"  (1.6 m)   Wt 140 lb (63.5 kg)   BMI 24.80 kg/m   Right knee There is some evidence of effusion Tenderness to palpation along the medial joint line She gets pain at full extension but is able to get to full extension With the McMurray test she gets a lot of medial joint line pain Flexion is limited to about 120 degrees before she gets pain  Left knee No pain with full extension or flexion to 140 degrees No pain with McMurray's or rotation  Data -I reviewed x-ray and ultrasound findings with the patient both which confirm the significant medial arthritis and patellofemoral arthritis

## 2019-12-29 NOTE — Assessment & Plan Note (Signed)
Review of patient's ultrasound and x-rays confirm severe arthritis of the medial compartment On x-ray she has mild but on ultrasound at least moderate patellofemoral compartment arthritis Lateral compartment is more mild change  She is not quite ready for surgery at this point and would like to know how much advanced cartilage breakdown she is having since the knee feels that it wants to give out at times We will proceed with an MRI We will give her tramadol twice a day to see if this significantly relieves symptoms If that is helpful we can renew this until she is ready to consider surgery  We could try injection with intra-articular Toradol just for pain relief but will hold off on this at this time  I did discuss that I think is reasonable for her to go ahead and see a knee replacement surgeon for their opinion

## 2020-01-03 ENCOUNTER — Telehealth: Payer: Self-pay | Admitting: Sports Medicine

## 2020-01-03 DIAGNOSIS — Z20822 Contact with and (suspected) exposure to covid-19: Secondary | ICD-10-CM | POA: Diagnosis not present

## 2020-01-03 NOTE — Telephone Encounter (Signed)
Patient called states has an upcoming MRI for Rt knee--- Per patient experiencing same issue w/Lt knee/ leg & wishes it to be included in the MRI.  --For warding message to medical asst for Order review request w/provider --pt's MRI for 01/22/20 to be Bilateral.  ---Please contact pt if there are any questions or concerns.  -glh

## 2020-01-13 DIAGNOSIS — Z20822 Contact with and (suspected) exposure to covid-19: Secondary | ICD-10-CM | POA: Diagnosis not present

## 2020-01-17 DIAGNOSIS — Z20822 Contact with and (suspected) exposure to covid-19: Secondary | ICD-10-CM | POA: Diagnosis not present

## 2020-01-22 ENCOUNTER — Other Ambulatory Visit: Payer: PPO

## 2020-01-25 DIAGNOSIS — Z20822 Contact with and (suspected) exposure to covid-19: Secondary | ICD-10-CM | POA: Diagnosis not present

## 2020-01-26 ENCOUNTER — Other Ambulatory Visit: Payer: Self-pay

## 2020-01-26 ENCOUNTER — Ambulatory Visit: Payer: PPO | Admitting: Family Medicine

## 2020-01-26 VITALS — BP 118/60 | Ht 62.0 in | Wt 140.0 lb

## 2020-01-26 DIAGNOSIS — G8929 Other chronic pain: Secondary | ICD-10-CM | POA: Diagnosis not present

## 2020-01-26 DIAGNOSIS — M25562 Pain in left knee: Secondary | ICD-10-CM

## 2020-01-26 NOTE — Patient Instructions (Signed)
We will request an MRI of your left knee as well as the already approved Right knee MRI. Come back to see [place] after this study has been done, to review the results and consider our next steps in your care.

## 2020-01-26 NOTE — Progress Notes (Signed)
mr

## 2020-01-26 NOTE — Progress Notes (Addendum)
Office Visit Note   Patient: Melissa Welch           Date of Birth: 30-Sep-1955           MRN: [id number] Visit Date: 01/26/2020 Requested by: No referring provider defined for this encounter. PCP: Dorena Cookey, MD (Inactive)  Subjective: CC: Chronic L knee pain, catching  HPI: 65 year old female presenting to clinic today with concerns of left knee pain.  Patient states that she has been seen multiple times for right knee pain, however over the past few months she has noticed her left pain having very similar symptoms as the right.  She says that her knee will occasionally catch when she tries to stand up, and she endorses significant pain over the medial aspect.  She denies any significant trauma, but states that she tries to be very active throughout the day.  She is already approved for an MRI of her right knee, but she was hoping that she could include the left as well as she feels that she would consider replacement on this side in the future.  She says that her knee will swell from time to time, especially on days when she has been more active.  She has tramadol on hand for when her pain is severe.  She is otherwise doing well, with no additional concerns today.              ROS:   All other systems were reviewed and are negative.  Objective: Vital Signs: BP 118/60   Ht 5\' 2"  (1.575 m)   Wt 140 lb (63.5 kg)   BMI 25.61 kg/m   Physical Exam:  General:  Alert and oriented, in no acute distress. Pulm:  Breathing unlabored. Psy:  Normal mood, congruent affect. Skin: Left knee with no bruising, rashes, or erythema.  Overlying skin intact. Left knee exam:  General: Normal gait  Moderate patellar crepitus with knee flexion and extension, Negative J-Sign.   Palpation: Endorses significant tenderness over the medial joint line, less tenderness over lateral joint line.  No tenderness with palpation of patella or patellar tendon.  Mild tenderness over patellar facets.    Supine exam: Trace effusion, normal patellar mobility.  Fullness in popliteal fossa.  Ligamentous Exam:  No pain or laxity with anterior/posterior drawer.  No obvious Sag.  No pain or laxity with varus/valgus stress across the knee.   Meniscus:  McMurray with significant pain within medial aspect, however no obvious deep clicking.  Strength: Hip flexion (L1), Hip Aduction (L2), Knee Extension (L3) are 5/5 Bilaterally Foot Inversion (L4), Dorsiflexion (L5), and Eversion (S1) 5/5 Bilaterally  Sensation: Intact to light touch medial and lateral aspects of lower extremities, and lateral, dorsal, and medial aspects of foot.    Imaging: Previous left knee x-rays obtained May 2021 at [facility] with mild subpatellar arthritis.  Assessment & Plan: 65 year old female presenting to clinic today with several months of left knee pain, accompanied by mechanical symptoms of catching when standing up.  Examination today was significant pain upon medial McMurray's, though no obvious clicking with this exam.  Given her reported mechanical symptoms, however, there remains concern for possible degenerative meniscal injury to this area.  Will submit for left knee MRI to accompany right knee MRI. -Patient instructed to schedule follow-up appointment after her MRI has been performed, to review these results.  She also says she would like to consider injection therapy at that time.  She recently started a new  job, and does not want to get a knee replacement until she has had several months to prove her job performance. -Encouraged to perform her exercises as prescribed for her right knee with her left knee as well. -Patient is happy with today's plan, she has no further questions or concerns.   I was the preceptor for this visit and available for immediate consultation Shellia Cleverly, DO

## 2020-01-31 DIAGNOSIS — Z20822 Contact with and (suspected) exposure to covid-19: Secondary | ICD-10-CM | POA: Diagnosis not present

## 2020-02-07 DIAGNOSIS — Z20822 Contact with and (suspected) exposure to covid-19: Secondary | ICD-10-CM | POA: Diagnosis not present

## 2020-02-10 ENCOUNTER — Ambulatory Visit
Admission: RE | Admit: 2020-02-10 | Discharge: 2020-02-10 | Disposition: A | Discharge status: Home / Self Care | Payer: PPO | Source: Ambulatory Visit | Attending: Sports Medicine | Admitting: Sports Medicine

## 2020-02-10 ENCOUNTER — Other Ambulatory Visit: Payer: Self-pay

## 2020-02-10 DIAGNOSIS — M25562 Pain in left knee: Secondary | ICD-10-CM | POA: Diagnosis not present

## 2020-02-10 DIAGNOSIS — G8929 Other chronic pain: Secondary | ICD-10-CM

## 2020-02-10 IMAGING — MR MR KNEE*R* W/O CM
4 of 6 series · 19 of 40 positions shown · non-contrast
Comparison: None.

CLINICAL DATA: Knee pain.  Right knee hurts to stand up.

EXAM:
MRI OF THE RIGHT KNEE WITHOUT CONTRAST
TECHNIQUE: Multiplanar, multisequence MR imaging of the knee was performed. No
intravenous contrast was administered.

[Series 2: T2 fat-sat · axial · 4.0mm · 0.29mm/px · z∈[-80,-5]mm · 3 of 27 slices shown (1 of 2)]
[im 5/27]
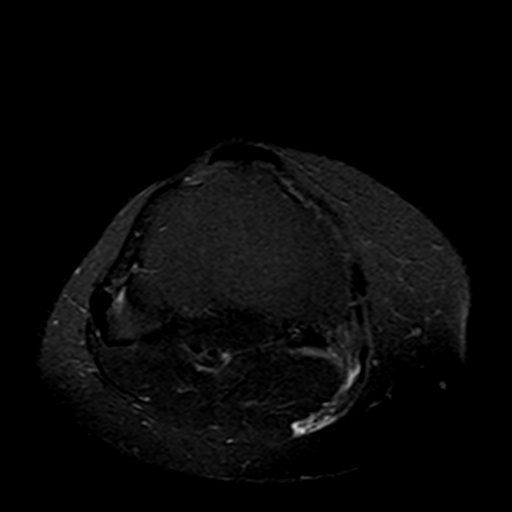
[im 14/27]
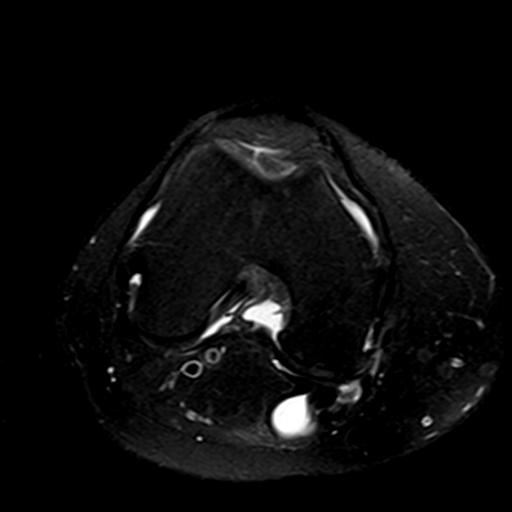
[im 22/27]
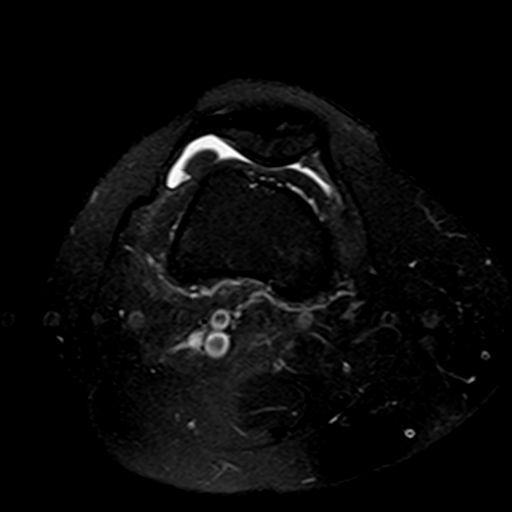

[Series 4: T2 fat-sat · coronal · 4.0mm · 0.29mm/px · 3 of 24 slices shown (2 of 2)]
[im 5/24]
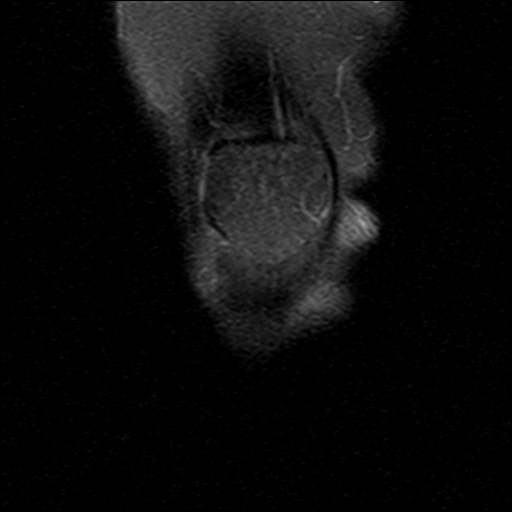
[im 14/24]
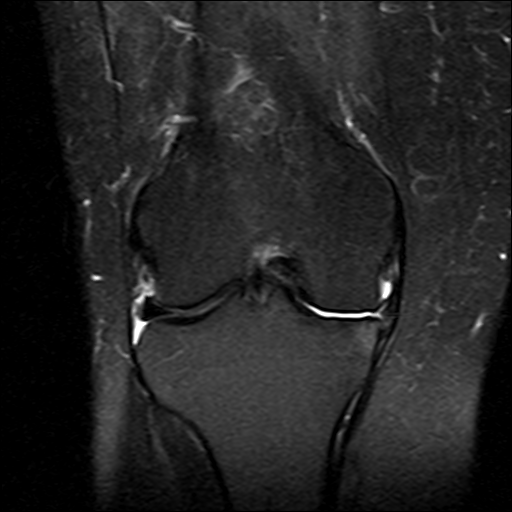
[im 24/24]
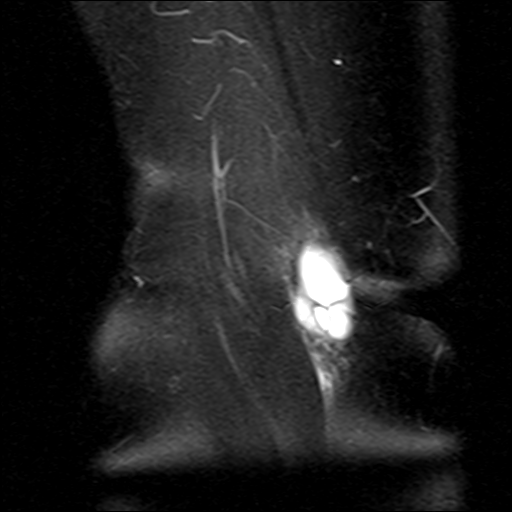

[Series 5: PD fat-sat · coronal · 3.0mm · 0.29mm/px · 7 of 28 slices shown (1 of 2)]
[im 1/28]
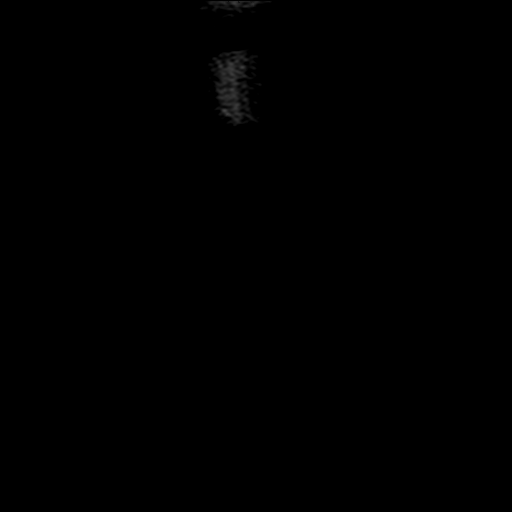
[im 5/28]
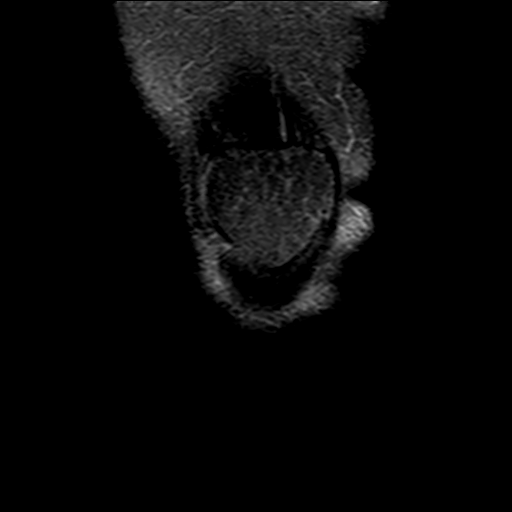
[im 10/28]
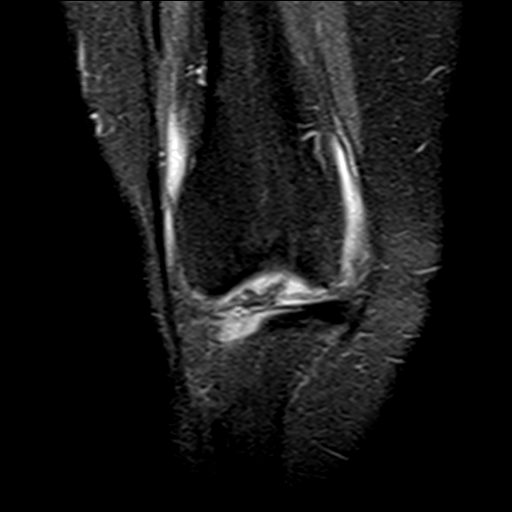
[im 14/28]
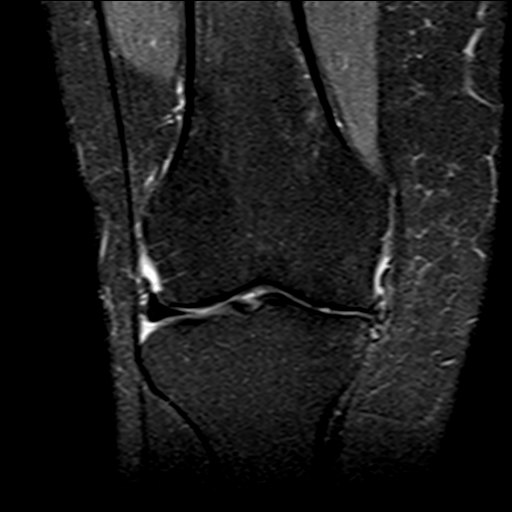
[im 19/28]
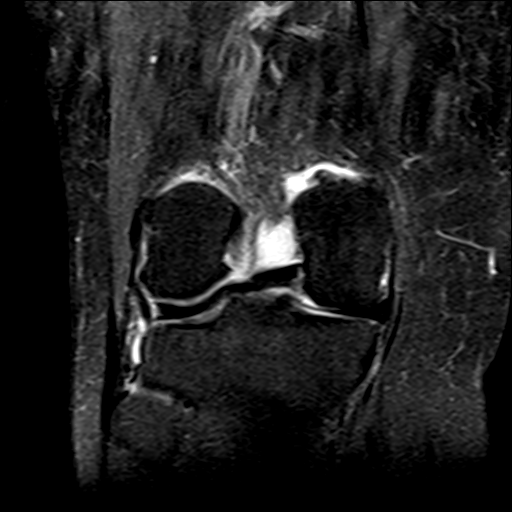
[im 23/28]
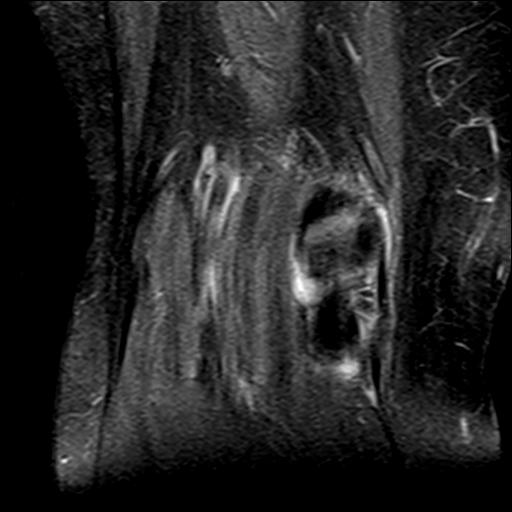
[im 28/28]
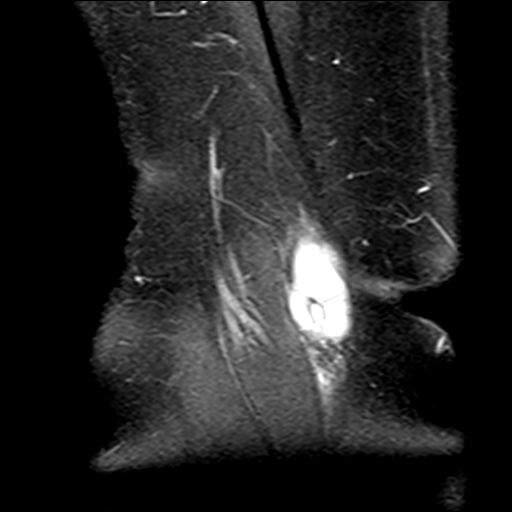

[Series 6: PD fat-sat · sagittal · 3.0mm · 0.29mm/px · 6 of 30 slices shown (2 of 2)]
[im 1/30]
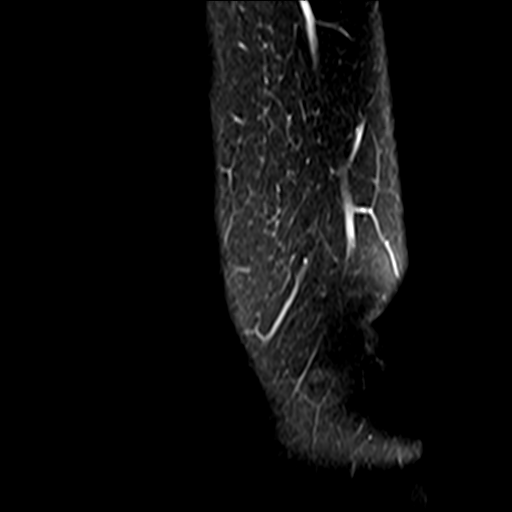
[im 5/30]
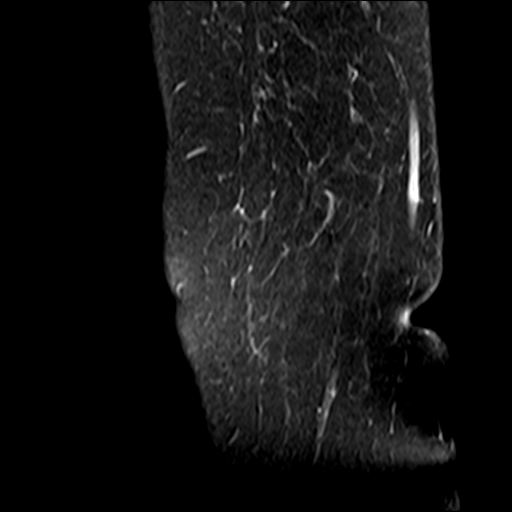
[im 10/30]
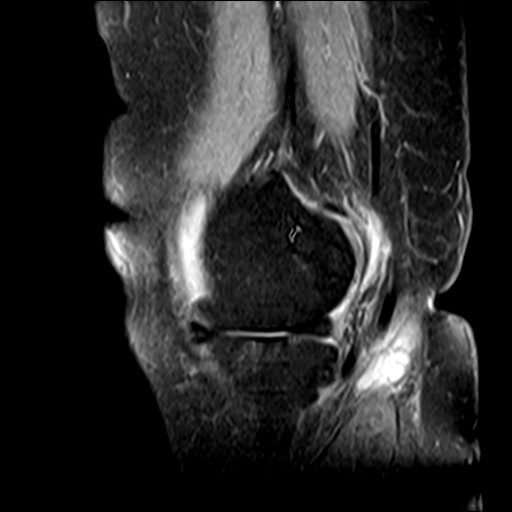
[im 15/30]
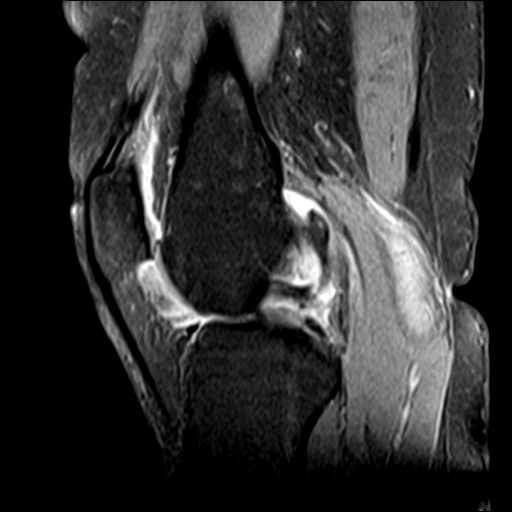
[im 20/30]
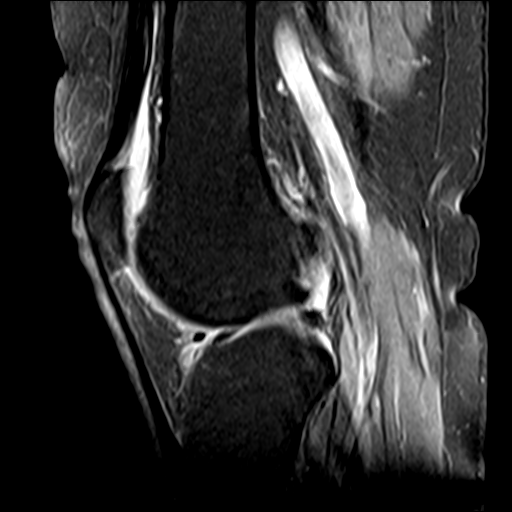
[im 25/30]
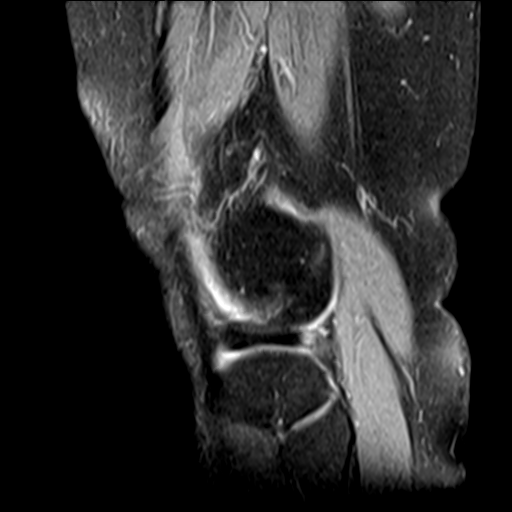

[19 of 40 positions shown; findings below may reference images not displayed]

FINDINGS: MENISCI

Medial: Radial tear of the body of the medial meniscus.

Lateral: Intact.

LIGAMENTS

Cruciates: ACL and PCL are intact.

Collaterals: Medial collateral ligament is intact. Lateral
collateral ligament complex is intact.

CARTILAGE

Patellofemoral: Partial-thickness cartilage loss of the
patellofemoral compartment.

Medial: Extensive full-thickness cartilage loss of the medial
femorotibial compartment.

Lateral: Mild partial-thickness cartilage loss of the lateral
femorotibial compartment.

JOINT: Small joint effusion. Normal PAEMER. No plical
thickening.

POPLITEAL FOSSA: Small Baker's cyst.  Intact popliteus tendon.

EXTENSOR MECHANISM: Intact quadriceps tendon. Intact patellar
tendon. Intact lateral patellar retinaculum. Intact medial patellar
retinaculum. Intact MPFL.

BONES: No aggressive osseous lesion. No fracture or dislocation.

Other: No fluid collection or hematoma. Muscles are normal.
IMPRESSION: 1. Radial tear of the body of the medial meniscus.
2. Tricompartmental cartilage abnormalities as described above.

## 2020-02-10 IMAGING — MR MR KNEE*L* W/O CM
4 of 6 series · 24 of 40 positions shown · non-contrast
Comparison: None.

CLINICAL DATA: Left knee pain.

EXAM:
MRI OF THE LEFT KNEE WITHOUT CONTRAST
TECHNIQUE: Multiplanar, multisequence MR imaging of the knee was performed. No
intravenous contrast was administered.

[Series 3: T2 fat-sat · axial · 4.0mm · 0.62mm/px · z∈[-86,+29]mm · 7 of 24 slices shown (1 of 2)]
[im 1/24]
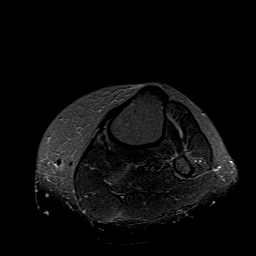
[im 4/24]
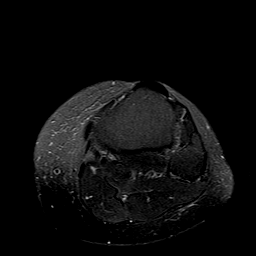
[im 8/24]
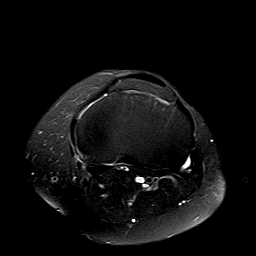
[im 12/24]
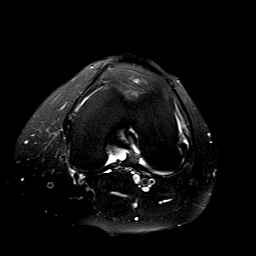
[im 16/24]
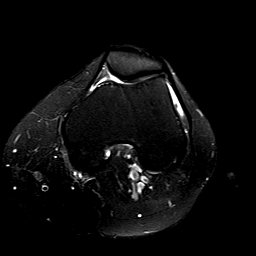
[im 20/24]
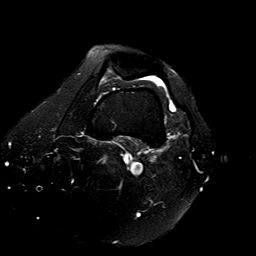
[im 24/24]
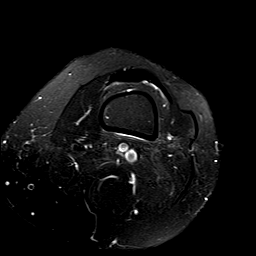

[Series 5: T2 fat-sat · coronal · 4.0mm · 0.29mm/px · 3 of 22 slices shown (2 of 2)]
[im 5/22]
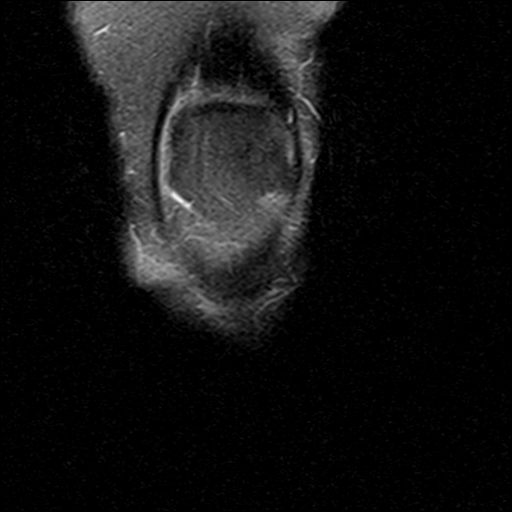
[im 13/22]
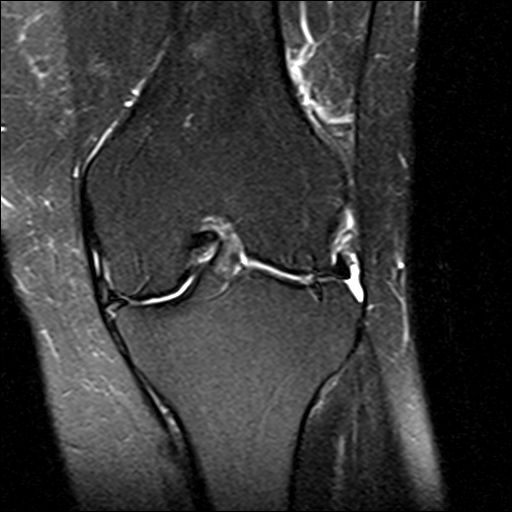
[im 22/22]
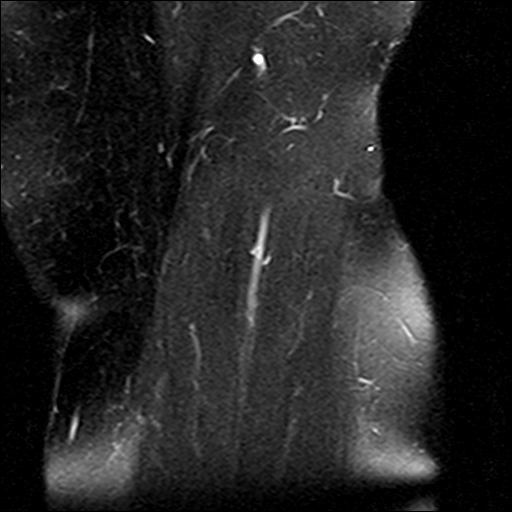

[Series 7: PD fat-sat · sagittal · 3.0mm · 0.29mm/px · 7 of 27 slices shown (1 of 2)]
[im 1/27]
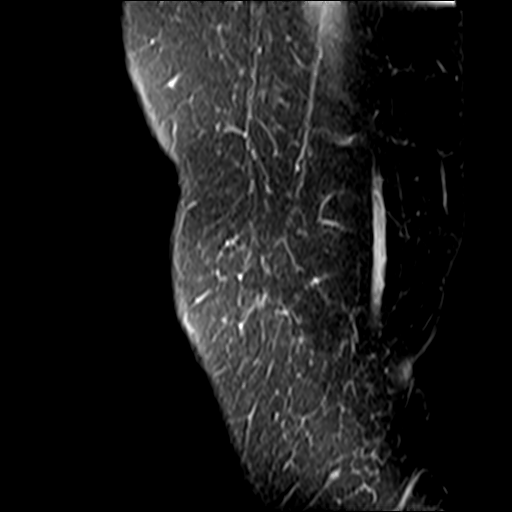
[im 5/27]
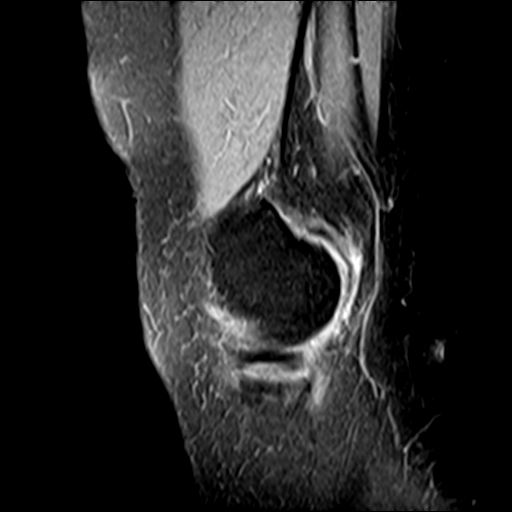
[im 9/27]
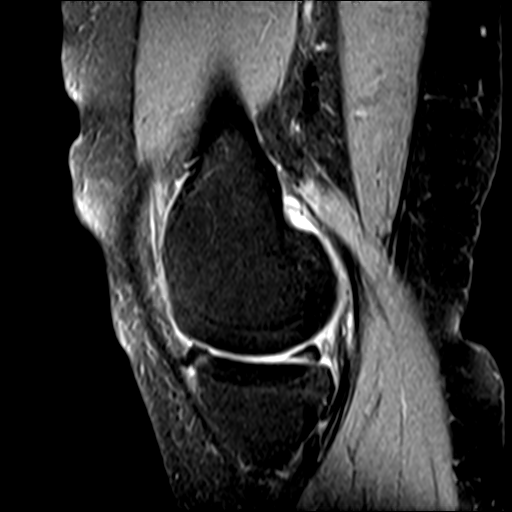
[im 14/27]
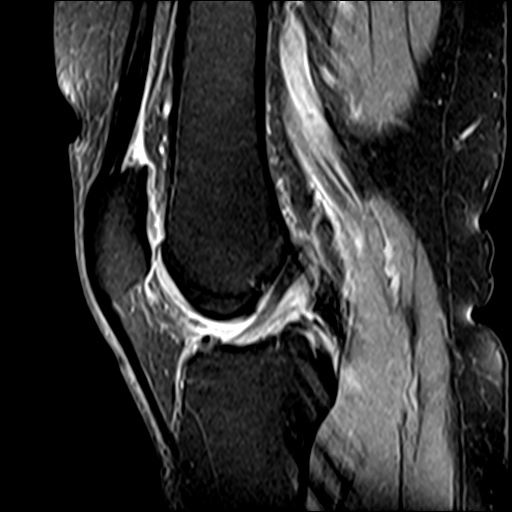
[im 18/27]
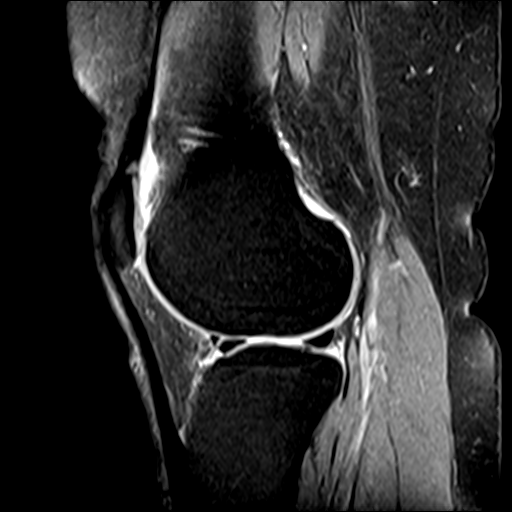
[im 22/27]
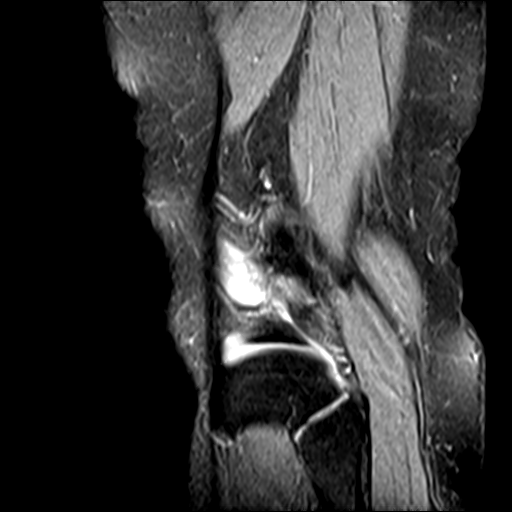
[im 27/27]
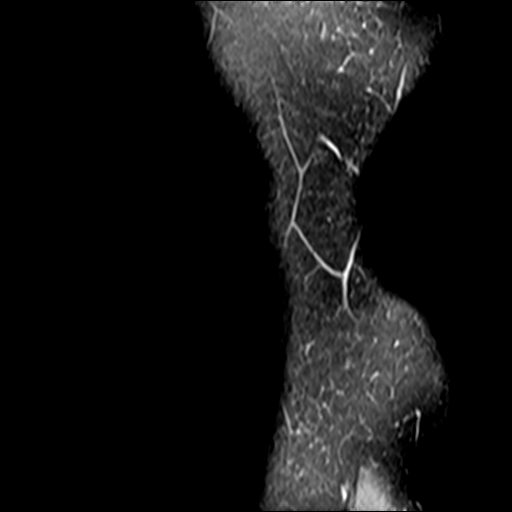

[Series 8: PD fat-sat · coronal · 3.0mm · 0.29mm/px · 7 of 28 slices shown (2 of 2)]
[im 1/28]
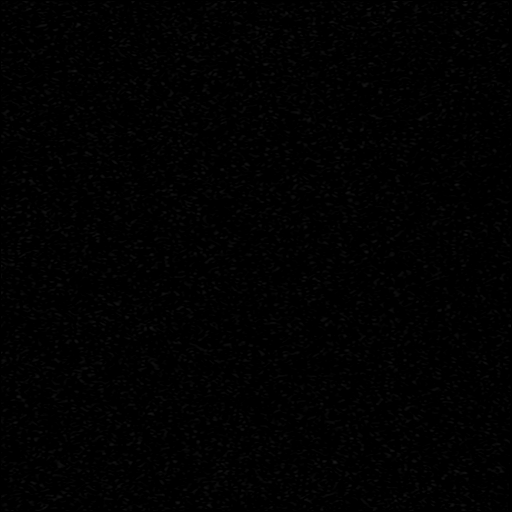
[im 5/28]
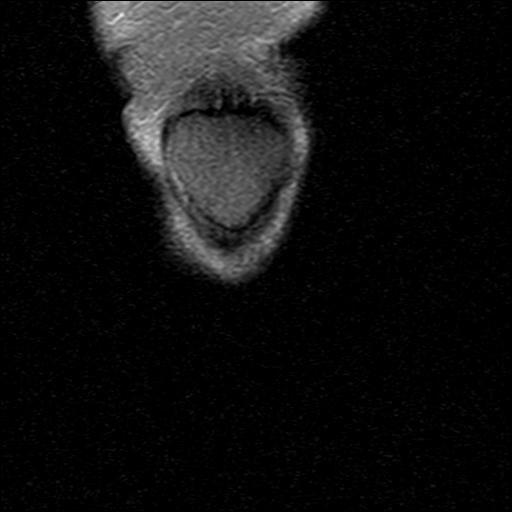
[im 10/28]
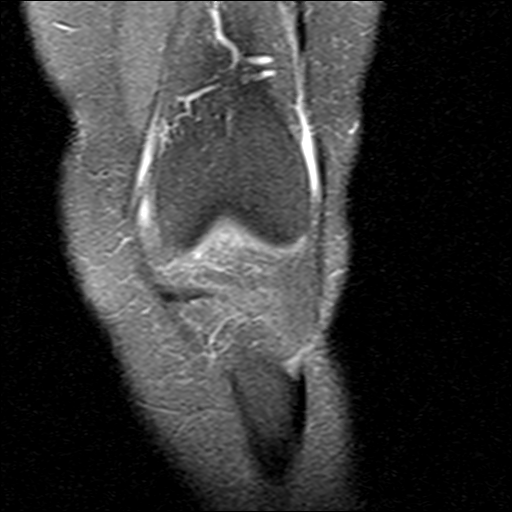
[im 14/28]
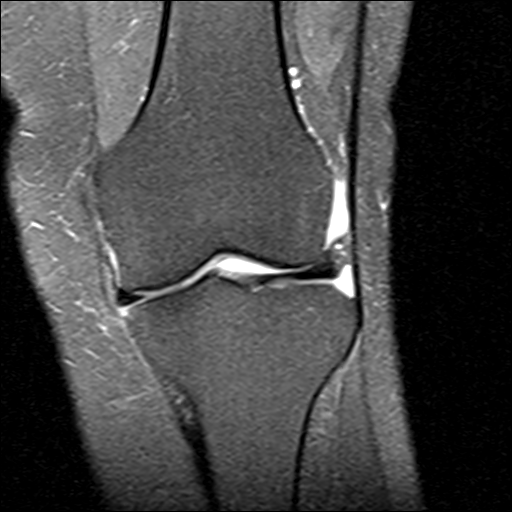
[im 19/28]
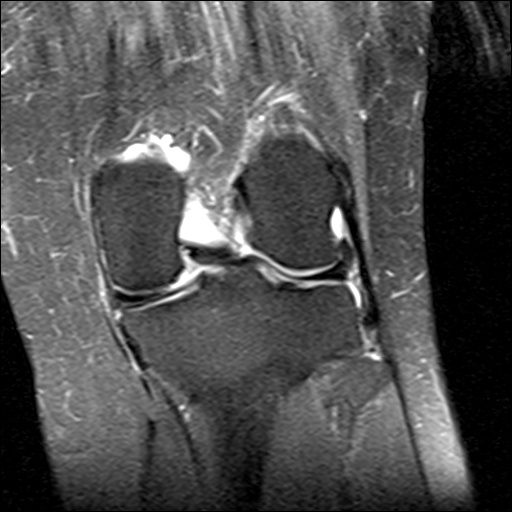
[im 23/28]
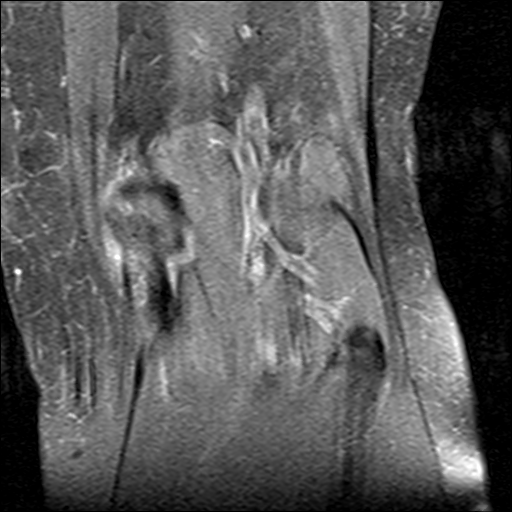
[im 28/28]
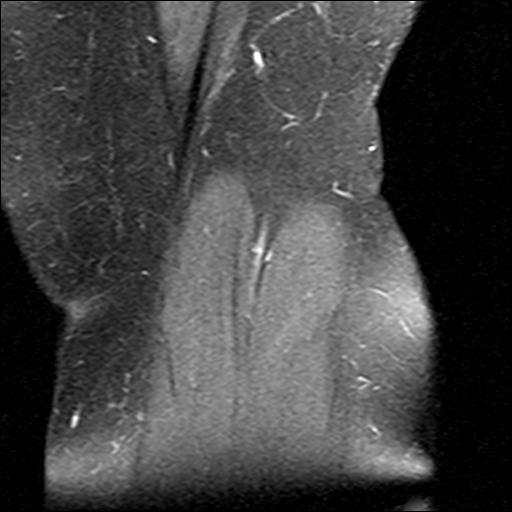

[24 of 40 positions shown; findings below may reference images not displayed]

FINDINGS: MENISCI

Medial: Oblique tear of the posterior horn of the medial meniscus
extending to the inferior articular surface and into the posterior
body.

Lateral: Tiny vertical tear of the free edge of the body of the
lateral meniscus.

LIGAMENTS

Cruciates: ACL and PCL are intact.

Collaterals: Medial collateral ligament is intact. Lateral
collateral ligament complex is intact.

CARTILAGE

Patellofemoral: Partial-thickness cartilage loss of the medial
patellofemoral compartment. Cartilage fissuring of the lateral
patellar facet.

Medial: Partial-thickness cartilage loss of the medial femorotibial
compartment.

Lateral: Mild partial-thickness cartilage loss of the lateral
femorotibial compartment.

JOINT: Trace joint effusion. Normal JHOVANNY. No plical
thickening.

POPLITEAL FOSSA: Popliteus tendon is intact. No Baker's cyst.

EXTENSOR MECHANISM: Intact quadriceps tendon. Intact patellar
tendon. Intact lateral patellar retinaculum. Intact medial patellar
retinaculum. Intact MPFL.

BONES: No aggressive osseous lesion. No fracture or dislocation.

Other: No fluid collection or hematoma. Muscles are normal.
IMPRESSION: 1. Oblique tear of the posterior horn of the medial meniscus
extending to the inferior articular surface and into the posterior
body.
2. Tiny vertical tear of the free edge of the body of the lateral
meniscus.
3. Tricompartmental cartilage abnormalities as described above.

## 2020-02-14 DIAGNOSIS — Z20822 Contact with and (suspected) exposure to covid-19: Secondary | ICD-10-CM | POA: Diagnosis not present

## 2020-02-21 DIAGNOSIS — Z20822 Contact with and (suspected) exposure to covid-19: Secondary | ICD-10-CM | POA: Diagnosis not present

## 2020-02-23 ENCOUNTER — Ambulatory Visit: Payer: PPO | Admitting: Family Medicine

## 2020-02-23 ENCOUNTER — Ambulatory Visit: Payer: PPO | Admitting: Sports Medicine

## 2020-02-23 ENCOUNTER — Other Ambulatory Visit: Payer: Self-pay

## 2020-02-23 VITALS — BP 112/70 | Ht 62.0 in | Wt 140.0 lb

## 2020-02-23 DIAGNOSIS — M25561 Pain in right knee: Secondary | ICD-10-CM

## 2020-02-23 DIAGNOSIS — M25562 Pain in left knee: Secondary | ICD-10-CM

## 2020-02-23 DIAGNOSIS — G8929 Other chronic pain: Secondary | ICD-10-CM

## 2020-02-23 MED ORDER — METHYLPREDNISOLONE ACETATE 40 MG/ML IJ SUSP
40.0000 mg | Freq: Once | INTRAMUSCULAR | Status: AC
  Administered 2020-02-23: 40 mg via INTRA_ARTICULAR

## 2020-02-23 NOTE — Progress Notes (Addendum)
Office Visit Note   Patient: Melissa Welch           Date of Birth: [birth year]           MRN: [id number] Visit Date: 02/23/2020 Requested by: No referring provider defined for this encounter. PCP: Dorena Cookey, MD (Inactive)  Subjective: CC: Follow up Bilateral Knee MRI  HPI: 65 year old female presenting to clinic today to follow-up on bilateral knee MRIs.  Patient continues to endorse significant pain and catching in both of her knees, primarily in the right.  Previously, patient did not feel as though she was ready for surgery although now she states that her knee pain is significantly limiting her on a day-to-day basis and she would consider replacement if needed.  Additionally, she wants to know if she could still possibly benefit from cortisone injections as she has had these in the past with waning benefit.  She states that her right knee will catch whenever she tries to go up and down stairs, or when raising from a seated position.  Her left knee catches on occasion, however not nearly as much as the right.  When it catches, it is significantly painful and take some time for her to unlock it.  She also works in a building with stairs, and says that going up and down the stairs throughout the day is significantly uncomfortable for her.  She has no new concerns today.              ROS:   All other systems were reviewed and are negative.  Objective: Vital Signs: BP 112/70   Ht 5\' 2"  (1.575 m)   Wt 140 lb (63.5 kg)   BMI 25.61 kg/m   Physical Exam:  General:  Alert and oriented, in no acute distress. Pulm:  Breathing unlabored. Psy:  Normal mood, congruent affect. Skin:  Bilateral legs with no bruising, rashes, or erythema.   Bilateral knee exam:  General: Normal gait Standing exam: No varus or valgus deformity of the knee. Seated Exam:  Moderate patellar crepitus.  Palpation: Endorses tenderness to palpation over medial as well as lateral joint lines. No  tenderness with palpation of patella or patellar tendon. No tenderness over patellar facets.   Supine exam: No effusion, normal patellar mobility.    Imaging: CLINICAL DATA:  Left knee pain.  EXAM: MRI OF THE LEFT KNEE WITHOUT CONTRAST  TECHNIQUE: Multiplanar, multisequence MR imaging of the knee was performed. No intravenous contrast was administered.  COMPARISON:  None.  FINDINGS: MENISCI  Medial: Oblique tear of the posterior horn of the medial meniscus extending to the inferior articular surface and into the posterior body.  Lateral: Tiny vertical tear of the free edge of the body of the lateral meniscus.  LIGAMENTS  Cruciates: ACL and PCL are intact.  Collaterals: Medial collateral ligament is intact. Lateral collateral ligament complex is intact.  CARTILAGE  Patellofemoral: Partial-thickness cartilage loss of the medial patellofemoral compartment. Cartilage fissuring of the lateral patellar facet.  Medial: Partial-thickness cartilage loss of the medial femorotibial compartment.  Lateral: Mild partial-thickness cartilage loss of the lateral femorotibial compartment.  JOINT: Trace joint effusion. Normal Hoffa's fat-pad. No plical thickening.  POPLITEAL FOSSA: Popliteus tendon is intact. No Baker's cyst.  EXTENSOR MECHANISM: Intact quadriceps tendon. Intact patellar tendon. Intact lateral patellar retinaculum. Intact medial patellar retinaculum. Intact MPFL.  BONES: No aggressive osseous lesion. No fracture or dislocation.  Other: No fluid collection or hematoma. Muscles are normal.  IMPRESSION: 1. Oblique tear  of the posterior horn of the medial meniscus extending to the inferior articular surface and into the posterior body. 2. Tiny vertical tear of the free edge of the body of the lateral meniscus. 3. Tricompartmental cartilage abnormalities as described above.   Electronically Signed   By: Kathreen Devoid   On: 02/11/2020  11:25  CLINICAL DATA:  Knee pain.  Right knee hurts to stand up.  EXAM: MRI OF THE RIGHT KNEE WITHOUT CONTRAST  TECHNIQUE: Multiplanar, multisequence MR imaging of the knee was performed. No intravenous contrast was administered.  COMPARISON:  None.  FINDINGS: MENISCI  Medial: Radial tear of the body of the medial meniscus.  Lateral: Intact.  LIGAMENTS  Cruciates: ACL and PCL are intact.  Collaterals: Medial collateral ligament is intact. Lateral collateral ligament complex is intact.  CARTILAGE  Patellofemoral: Partial-thickness cartilage loss of the patellofemoral compartment.  Medial: Extensive full-thickness cartilage loss of the medial femorotibial compartment.  Lateral: Mild partial-thickness cartilage loss of the lateral femorotibial compartment.  JOINT: Small joint effusion. Normal Hoffa's fat-pad. No plical thickening.  POPLITEAL FOSSA: Small Baker's cyst.  Intact popliteus tendon.  EXTENSOR MECHANISM: Intact quadriceps tendon. Intact patellar tendon. Intact lateral patellar retinaculum. Intact medial patellar retinaculum. Intact MPFL.  BONES: No aggressive osseous lesion. No fracture or dislocation.  Other: No fluid collection or hematoma. Muscles are normal.  IMPRESSION: 1. Radial tear of the body of the medial meniscus. 2. Tricompartmental cartilage abnormalities as described above.   Electronically Signed   By: Kathreen Devoid   On: 02/11/2020 11:22  Assessment & Plan: 65 year old female presenting to clinic today to follow-up on bilateral knee MRIs.  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  Patient continues to endorse catching and locking sensations, as well as diffuse aching pain.  At this time, she says that she would be interested in talking to a surgeon about the possibility of knee replacements.  She would also like to try injection therapy today.  -Injection therapy  performed as described below, which patient tolerated very well.  Aftercare and return precautions discussed. -Referral placed to [facility] to discuss surgical options.  -Return to clinic as needed.  Patient had no further questions or concerns today.     Procedures: Bilateral knee cortisone Injection:  Risks and benefits of procedure discussed, Patient opted to proceed.  Written consent obtained.  Timeout performed.  Skin prepped in a sterile fashion with betadine before further cleansing with alcohol. Ethyl Chloride was used for topical analgesia.  Right knee was injected with 3cc 1% Lidocaine without epinephrine mixed with 40 mg methylprednisolone via the suprapatellar approach using a 25G, 1.5in needle.  Procedure was then repeated on the left knee.  Patient tolerated the injection well with no immediate complications. Aftercare instructions were discussed, and patient was given strict return precautions.    Addendum:  I was the preceptor for this visit and available for immediate consultation.  Karlton Lemon MD Kirt Boys

## 2020-02-28 DIAGNOSIS — Z20822 Contact with and (suspected) exposure to covid-19: Secondary | ICD-10-CM | POA: Diagnosis not present

## 2020-03-06 DIAGNOSIS — M1712 Unilateral primary osteoarthritis, left knee: Secondary | ICD-10-CM | POA: Diagnosis not present

## 2020-03-06 DIAGNOSIS — Z20822 Contact with and (suspected) exposure to covid-19: Secondary | ICD-10-CM | POA: Diagnosis not present

## 2020-03-06 DIAGNOSIS — M1711 Unilateral primary osteoarthritis, right knee: Secondary | ICD-10-CM | POA: Diagnosis not present

## 2020-03-06 DIAGNOSIS — M25761 Osteophyte, right knee: Secondary | ICD-10-CM | POA: Diagnosis not present

## 2020-03-06 DIAGNOSIS — M25561 Pain in right knee: Secondary | ICD-10-CM | POA: Diagnosis not present

## 2020-03-12 DIAGNOSIS — Z20822 Contact with and (suspected) exposure to covid-19: Secondary | ICD-10-CM | POA: Diagnosis not present

## 2020-03-19 DIAGNOSIS — Z20822 Contact with and (suspected) exposure to covid-19: Secondary | ICD-10-CM | POA: Diagnosis not present

## 2020-03-26 DIAGNOSIS — Z20822 Contact with and (suspected) exposure to covid-19: Secondary | ICD-10-CM | POA: Diagnosis not present

## 2020-03-28 DIAGNOSIS — F331 Major depressive disorder, recurrent, moderate: Secondary | ICD-10-CM | POA: Diagnosis not present

## 2020-03-28 DIAGNOSIS — F411 Generalized anxiety disorder: Secondary | ICD-10-CM | POA: Diagnosis not present

## 2020-03-28 DIAGNOSIS — G4700 Insomnia, unspecified: Secondary | ICD-10-CM | POA: Diagnosis not present

## 2020-04-02 DIAGNOSIS — Z1152 Encounter for screening for COVID-19: Secondary | ICD-10-CM | POA: Diagnosis not present

## 2020-04-18 DIAGNOSIS — Z20822 Contact with and (suspected) exposure to covid-19: Secondary | ICD-10-CM | POA: Diagnosis not present

## 2020-04-25 DIAGNOSIS — Z20822 Contact with and (suspected) exposure to covid-19: Secondary | ICD-10-CM | POA: Diagnosis not present

## 2020-05-02 DIAGNOSIS — Z20822 Contact with and (suspected) exposure to covid-19: Secondary | ICD-10-CM | POA: Diagnosis not present

## 2020-05-09 DIAGNOSIS — Z1152 Encounter for screening for COVID-19: Secondary | ICD-10-CM | POA: Diagnosis not present

## 2020-05-16 DIAGNOSIS — Z1152 Encounter for screening for COVID-19: Secondary | ICD-10-CM | POA: Diagnosis not present

## 2020-05-23 DIAGNOSIS — Z1152 Encounter for screening for COVID-19: Secondary | ICD-10-CM | POA: Diagnosis not present

## 2020-05-30 DIAGNOSIS — Z1152 Encounter for screening for COVID-19: Secondary | ICD-10-CM | POA: Diagnosis not present

## 2020-05-31 DIAGNOSIS — J4520 Mild intermittent asthma, uncomplicated: Secondary | ICD-10-CM | POA: Diagnosis not present

## 2020-05-31 DIAGNOSIS — E782 Mixed hyperlipidemia: Secondary | ICD-10-CM | POA: Diagnosis not present

## 2020-05-31 DIAGNOSIS — G4700 Insomnia, unspecified: Secondary | ICD-10-CM | POA: Diagnosis not present

## 2020-05-31 DIAGNOSIS — Z13220 Encounter for screening for lipoid disorders: Secondary | ICD-10-CM | POA: Diagnosis not present

## 2020-05-31 DIAGNOSIS — R252 Cramp and spasm: Secondary | ICD-10-CM | POA: Diagnosis not present

## 2020-05-31 DIAGNOSIS — Z79899 Other long term (current) drug therapy: Secondary | ICD-10-CM | POA: Diagnosis not present

## 2020-05-31 DIAGNOSIS — M1711 Unilateral primary osteoarthritis, right knee: Secondary | ICD-10-CM | POA: Diagnosis not present

## 2020-05-31 DIAGNOSIS — Z780 Asymptomatic menopausal state: Secondary | ICD-10-CM | POA: Diagnosis not present

## 2020-05-31 DIAGNOSIS — F331 Major depressive disorder, recurrent, moderate: Secondary | ICD-10-CM | POA: Diagnosis not present

## 2020-05-31 DIAGNOSIS — Z1329 Encounter for screening for other suspected endocrine disorder: Secondary | ICD-10-CM | POA: Diagnosis not present

## 2020-05-31 DIAGNOSIS — Z0001 Encounter for general adult medical examination with abnormal findings: Secondary | ICD-10-CM | POA: Diagnosis not present

## 2020-05-31 DIAGNOSIS — F411 Generalized anxiety disorder: Secondary | ICD-10-CM | POA: Diagnosis not present

## 2020-05-31 DIAGNOSIS — K219 Gastro-esophageal reflux disease without esophagitis: Secondary | ICD-10-CM | POA: Diagnosis not present

## 2020-05-31 DIAGNOSIS — R7301 Impaired fasting glucose: Secondary | ICD-10-CM | POA: Diagnosis not present

## 2020-06-08 DIAGNOSIS — R252 Cramp and spasm: Secondary | ICD-10-CM | POA: Diagnosis not present

## 2020-06-08 DIAGNOSIS — G4700 Insomnia, unspecified: Secondary | ICD-10-CM | POA: Diagnosis not present

## 2020-06-08 DIAGNOSIS — J4520 Mild intermittent asthma, uncomplicated: Secondary | ICD-10-CM | POA: Diagnosis not present

## 2020-06-08 DIAGNOSIS — Z0000 Encounter for general adult medical examination without abnormal findings: Secondary | ICD-10-CM | POA: Diagnosis not present

## 2020-06-08 DIAGNOSIS — F411 Generalized anxiety disorder: Secondary | ICD-10-CM | POA: Diagnosis not present

## 2020-06-08 DIAGNOSIS — F331 Major depressive disorder, recurrent, moderate: Secondary | ICD-10-CM | POA: Diagnosis not present

## 2020-06-08 DIAGNOSIS — E782 Mixed hyperlipidemia: Secondary | ICD-10-CM | POA: Diagnosis not present

## 2020-06-08 DIAGNOSIS — R7301 Impaired fasting glucose: Secondary | ICD-10-CM | POA: Diagnosis not present

## 2020-06-08 DIAGNOSIS — Z780 Asymptomatic menopausal state: Secondary | ICD-10-CM | POA: Diagnosis not present

## 2020-06-08 DIAGNOSIS — M1711 Unilateral primary osteoarthritis, right knee: Secondary | ICD-10-CM | POA: Diagnosis not present

## 2020-06-08 DIAGNOSIS — K219 Gastro-esophageal reflux disease without esophagitis: Secondary | ICD-10-CM | POA: Diagnosis not present

## 2020-06-13 DIAGNOSIS — Z1152 Encounter for screening for COVID-19: Secondary | ICD-10-CM | POA: Diagnosis not present

## 2020-06-20 DIAGNOSIS — Z1152 Encounter for screening for COVID-19: Secondary | ICD-10-CM | POA: Diagnosis not present

## 2020-06-27 DIAGNOSIS — Z1152 Encounter for screening for COVID-19: Secondary | ICD-10-CM | POA: Diagnosis not present

## 2020-07-04 DIAGNOSIS — Z1152 Encounter for screening for COVID-19: Secondary | ICD-10-CM | POA: Diagnosis not present

## 2020-07-11 DIAGNOSIS — Z20822 Contact with and (suspected) exposure to covid-19: Secondary | ICD-10-CM | POA: Diagnosis not present

## 2020-07-16 DIAGNOSIS — G4700 Insomnia, unspecified: Secondary | ICD-10-CM | POA: Diagnosis not present

## 2020-07-16 DIAGNOSIS — F411 Generalized anxiety disorder: Secondary | ICD-10-CM | POA: Diagnosis not present

## 2020-07-16 DIAGNOSIS — F331 Major depressive disorder, recurrent, moderate: Secondary | ICD-10-CM | POA: Diagnosis not present

## 2020-07-18 DIAGNOSIS — R5383 Other fatigue: Secondary | ICD-10-CM | POA: Diagnosis not present

## 2020-07-18 DIAGNOSIS — M1711 Unilateral primary osteoarthritis, right knee: Secondary | ICD-10-CM | POA: Diagnosis not present

## 2020-07-18 DIAGNOSIS — Z20822 Contact with and (suspected) exposure to covid-19: Secondary | ICD-10-CM | POA: Diagnosis not present

## 2020-07-18 DIAGNOSIS — M25562 Pain in left knee: Secondary | ICD-10-CM | POA: Diagnosis not present

## 2020-07-18 DIAGNOSIS — M25561 Pain in right knee: Secondary | ICD-10-CM | POA: Diagnosis not present

## 2020-07-25 DIAGNOSIS — M25562 Pain in left knee: Secondary | ICD-10-CM | POA: Diagnosis not present

## 2020-07-25 DIAGNOSIS — M1711 Unilateral primary osteoarthritis, right knee: Secondary | ICD-10-CM | POA: Diagnosis not present

## 2020-07-25 DIAGNOSIS — Z20822 Contact with and (suspected) exposure to covid-19: Secondary | ICD-10-CM | POA: Diagnosis not present

## 2020-07-25 DIAGNOSIS — M25561 Pain in right knee: Secondary | ICD-10-CM | POA: Diagnosis not present

## 2020-07-26 DIAGNOSIS — M25561 Pain in right knee: Secondary | ICD-10-CM | POA: Diagnosis not present

## 2020-07-26 DIAGNOSIS — M1711 Unilateral primary osteoarthritis, right knee: Secondary | ICD-10-CM | POA: Diagnosis not present

## 2020-07-26 DIAGNOSIS — M25562 Pain in left knee: Secondary | ICD-10-CM | POA: Diagnosis not present

## 2020-08-01 DIAGNOSIS — Z20822 Contact with and (suspected) exposure to covid-19: Secondary | ICD-10-CM | POA: Diagnosis not present

## 2020-08-01 DIAGNOSIS — M25562 Pain in left knee: Secondary | ICD-10-CM | POA: Diagnosis not present

## 2020-08-01 DIAGNOSIS — M1711 Unilateral primary osteoarthritis, right knee: Secondary | ICD-10-CM | POA: Diagnosis not present

## 2020-08-01 DIAGNOSIS — M25561 Pain in right knee: Secondary | ICD-10-CM | POA: Diagnosis not present

## 2020-08-02 DIAGNOSIS — M1711 Unilateral primary osteoarthritis, right knee: Secondary | ICD-10-CM | POA: Diagnosis not present

## 2020-08-02 DIAGNOSIS — M25562 Pain in left knee: Secondary | ICD-10-CM | POA: Diagnosis not present

## 2020-08-02 DIAGNOSIS — M25561 Pain in right knee: Secondary | ICD-10-CM | POA: Diagnosis not present

## 2020-08-08 DIAGNOSIS — Z20822 Contact with and (suspected) exposure to covid-19: Secondary | ICD-10-CM | POA: Diagnosis not present

## 2020-08-15 DIAGNOSIS — Z1152 Encounter for screening for COVID-19: Secondary | ICD-10-CM | POA: Diagnosis not present

## 2020-08-16 DIAGNOSIS — M1711 Unilateral primary osteoarthritis, right knee: Secondary | ICD-10-CM | POA: Diagnosis not present

## 2020-08-16 DIAGNOSIS — M25561 Pain in right knee: Secondary | ICD-10-CM | POA: Diagnosis not present

## 2020-08-16 DIAGNOSIS — M25562 Pain in left knee: Secondary | ICD-10-CM | POA: Diagnosis not present

## 2020-08-22 DIAGNOSIS — M25562 Pain in left knee: Secondary | ICD-10-CM | POA: Diagnosis not present

## 2020-08-22 DIAGNOSIS — M25561 Pain in right knee: Secondary | ICD-10-CM | POA: Diagnosis not present

## 2020-08-22 DIAGNOSIS — Z1152 Encounter for screening for COVID-19: Secondary | ICD-10-CM | POA: Diagnosis not present

## 2020-08-22 DIAGNOSIS — M1711 Unilateral primary osteoarthritis, right knee: Secondary | ICD-10-CM | POA: Diagnosis not present

## 2020-08-29 DIAGNOSIS — Z20822 Contact with and (suspected) exposure to covid-19: Secondary | ICD-10-CM | POA: Diagnosis not present

## 2020-09-05 DIAGNOSIS — M25561 Pain in right knee: Secondary | ICD-10-CM | POA: Diagnosis not present

## 2020-09-05 DIAGNOSIS — M1711 Unilateral primary osteoarthritis, right knee: Secondary | ICD-10-CM | POA: Diagnosis not present

## 2020-09-05 DIAGNOSIS — Z1152 Encounter for screening for COVID-19: Secondary | ICD-10-CM | POA: Diagnosis not present

## 2020-09-05 DIAGNOSIS — M25562 Pain in left knee: Secondary | ICD-10-CM | POA: Diagnosis not present

## 2020-09-12 DIAGNOSIS — Z20822 Contact with and (suspected) exposure to covid-19: Secondary | ICD-10-CM | POA: Diagnosis not present

## 2020-09-19 DIAGNOSIS — R059 Cough, unspecified: Secondary | ICD-10-CM | POA: Diagnosis not present

## 2020-09-26 DIAGNOSIS — Z20822 Contact with and (suspected) exposure to covid-19: Secondary | ICD-10-CM | POA: Diagnosis not present

## 2020-10-02 ENCOUNTER — Ambulatory Visit: Payer: PPO | Admitting: Sports Medicine

## 2020-10-02 ENCOUNTER — Encounter: Payer: Self-pay | Admitting: Sports Medicine

## 2020-10-02 DIAGNOSIS — M170 Bilateral primary osteoarthritis of knee: Secondary | ICD-10-CM | POA: Insufficient documentation

## 2020-10-02 NOTE — Patient Instructions (Addendum)
We are going to refer you to Dr. Mayer Camel for your knees.  Melissa Welch, [name]  Appt: 10/09/20 @ 1:15 pm. Please arrive at 12:45 pm.

## 2020-10-02 NOTE — Assessment & Plan Note (Addendum)
Patient counseled extensively on consideration for knee replacement.  Patient states that she is amenable to this option as she has tried PRP, stem cell injections in steroid injections in the past with minimal relief.  Patient has also completed physical therapy.  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 Pain has continued to increase in severity and limits patient's quality of life including affecting her sleep.  Will refer to Dr. Mayer Camel for evaluation for right knee replacement, discussed importance of her deciding whether she wants to do right or left knee first

## 2020-10-02 NOTE — Progress Notes (Signed)
   PCP: Dorena Cookey, MD (Inactive)  Subjective:   HPI: Patient is a 65 y.o. female here for bilateral knee pain.  Patient presents for follow up for bilateral knee pain. She reports previous evaluation by  pain management where she received PRP injections and stem cell injections.  Last set of injections were in May of this year.  Today she reports she continues to have severe bilateral knee pain with right worse than the left.  She states that it prevents her from sleeping well at night.  She reports pain on the medial aspect of bilateral knees.  Pain is decreased with walking however worse with any external rotation of her feet she also reports is difficult to move from a seated to standing position.  Patient reports some weakness in her knees with walking up stairs especially on the right.  She has tried using a brace for the last 4 months without any relief or improvement.  Patient reports that she takes Aleve once daily for pain.  She has tried physical therapy and states that the massage helped with the pain with some return.  Following her injections of PRP and stem cells, she reported minimal relief.  Patient reports that she is ready for surgery and would like to have a repeat MRI possible.       Objective:  Physical Exam: Ht 5\' 2"  (1.575 m)   Wt 145 lb (65.8 kg)   BMI 26.52 kg/m   Gen: awake, alert, NAD, comfortable in exam room Pulm: breathing unlabored  Knee: - Inspection: no gross deformity. Some swelling/effusion, no erythema or bruising. Skin intact - Palpation:  TTP along medial aspect of both knee joints - ROM: limited range of motion especially on left ROM with flexion, continues to have bilateral normal extension in knees bilaterally, pain with ROM maneuvers bilaterally  - Strength: 4/5 strength on right, 5/5 on left  - Neuro/vasc: NV intact - Special Tests: - LIGAMENTS: negative anterior and posterior drawer, negative Lachman's, no MCL or LCL laxity -- MENISCUS:+  McMurray's -- PF JOINT: nml patellar mobility bilaterally.  negative patellar grind, negative patellar apprehension      Assessment & Plan:    Bilateral primary osteoarthritis of knee Patient counseled extensively on consideration for knee replacement.  Patient states that she is amenable to this option as she has tried PRP, stem cell injections in steroid injections in the past with minimal relief.  Patient has also completed physical therapy.  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 Pain has continued to increase in severity and limits patient's quality of life including affecting her sleep.  Will refer to Dr. Mayer Camel for evaluation for right knee replacement, discussed importance of her deciding whether she wants to do right or left knee first   No orders of the defined types were placed in this encounter.   No orders of the defined types were placed in this encounter.   Eulis Foster, MD [place], PGY-3 10/02/2020 10:01 AM I observed and examined the patient with the resident and agree with assessment and plan.  Note reviewed and modified by me. [name], MD

## 2020-10-03 DIAGNOSIS — Z20822 Contact with and (suspected) exposure to covid-19: Secondary | ICD-10-CM | POA: Diagnosis not present

## 2020-10-09 DIAGNOSIS — S83242A Other tear of medial meniscus, current injury, left knee, initial encounter: Secondary | ICD-10-CM | POA: Diagnosis not present

## 2020-10-09 DIAGNOSIS — M1711 Unilateral primary osteoarthritis, right knee: Secondary | ICD-10-CM | POA: Diagnosis not present

## 2020-10-10 DIAGNOSIS — Z6829 Body mass index (BMI) 29.0-29.9, adult: Secondary | ICD-10-CM | POA: Diagnosis not present

## 2020-10-10 DIAGNOSIS — Z20822 Contact with and (suspected) exposure to covid-19: Secondary | ICD-10-CM | POA: Diagnosis not present

## 2020-10-17 DIAGNOSIS — Z20822 Contact with and (suspected) exposure to covid-19: Secondary | ICD-10-CM | POA: Diagnosis not present

## 2020-10-24 DIAGNOSIS — Z20822 Contact with and (suspected) exposure to covid-19: Secondary | ICD-10-CM | POA: Diagnosis not present

## 2020-10-24 DIAGNOSIS — Z6829 Body mass index (BMI) 29.0-29.9, adult: Secondary | ICD-10-CM | POA: Diagnosis not present

## 2020-10-25 DIAGNOSIS — M1711 Unilateral primary osteoarthritis, right knee: Secondary | ICD-10-CM | POA: Diagnosis not present

## 2020-10-25 DIAGNOSIS — Z79899 Other long term (current) drug therapy: Secondary | ICD-10-CM | POA: Diagnosis not present

## 2020-10-25 DIAGNOSIS — Z01818 Encounter for other preprocedural examination: Secondary | ICD-10-CM | POA: Diagnosis not present

## 2020-10-26 DIAGNOSIS — R9431 Abnormal electrocardiogram [ECG] [EKG]: Secondary | ICD-10-CM | POA: Diagnosis not present

## 2020-10-26 DIAGNOSIS — Z01810 Encounter for preprocedural cardiovascular examination: Secondary | ICD-10-CM | POA: Diagnosis not present

## 2020-10-29 DIAGNOSIS — M1711 Unilateral primary osteoarthritis, right knee: Secondary | ICD-10-CM | POA: Diagnosis not present

## 2020-10-31 DIAGNOSIS — Z20822 Contact with and (suspected) exposure to covid-19: Secondary | ICD-10-CM | POA: Diagnosis not present

## 2020-10-31 DIAGNOSIS — Z6830 Body mass index (BMI) 30.0-30.9, adult: Secondary | ICD-10-CM | POA: Diagnosis not present

## 2020-11-07 DIAGNOSIS — Z20822 Contact with and (suspected) exposure to covid-19: Secondary | ICD-10-CM | POA: Diagnosis not present

## 2020-11-08 DIAGNOSIS — M1711 Unilateral primary osteoarthritis, right knee: Secondary | ICD-10-CM | POA: Diagnosis not present

## 2020-11-16 DIAGNOSIS — X58XXXA Exposure to other specified factors, initial encounter: Secondary | ICD-10-CM | POA: Diagnosis not present

## 2020-11-16 DIAGNOSIS — S83242A Other tear of medial meniscus, current injury, left knee, initial encounter: Secondary | ICD-10-CM | POA: Diagnosis not present

## 2020-11-16 DIAGNOSIS — M1711 Unilateral primary osteoarthritis, right knee: Secondary | ICD-10-CM | POA: Diagnosis not present

## 2020-11-16 DIAGNOSIS — Z96651 Presence of right artificial knee joint: Secondary | ICD-10-CM | POA: Diagnosis not present

## 2020-11-16 DIAGNOSIS — M21161 Varus deformity, not elsewhere classified, right knee: Secondary | ICD-10-CM | POA: Diagnosis not present

## 2020-11-16 DIAGNOSIS — Y999 Unspecified external cause status: Secondary | ICD-10-CM | POA: Diagnosis not present

## 2020-11-16 DIAGNOSIS — G8918 Other acute postprocedural pain: Secondary | ICD-10-CM | POA: Diagnosis not present

## 2020-11-16 DIAGNOSIS — M94262 Chondromalacia, left knee: Secondary | ICD-10-CM | POA: Diagnosis not present

## 2020-11-19 DIAGNOSIS — M6281 Muscle weakness (generalized): Secondary | ICD-10-CM | POA: Diagnosis not present

## 2020-11-19 DIAGNOSIS — Z96651 Presence of right artificial knee joint: Secondary | ICD-10-CM | POA: Diagnosis not present

## 2020-11-19 DIAGNOSIS — M25661 Stiffness of right knee, not elsewhere classified: Secondary | ICD-10-CM | POA: Diagnosis not present

## 2020-11-19 DIAGNOSIS — M25561 Pain in right knee: Secondary | ICD-10-CM | POA: Diagnosis not present

## 2020-11-21 DIAGNOSIS — R9431 Abnormal electrocardiogram [ECG] [EKG]: Secondary | ICD-10-CM | POA: Diagnosis not present

## 2020-11-21 DIAGNOSIS — R4182 Altered mental status, unspecified: Secondary | ICD-10-CM | POA: Diagnosis not present

## 2020-11-21 DIAGNOSIS — D509 Iron deficiency anemia, unspecified: Secondary | ICD-10-CM | POA: Diagnosis not present

## 2020-11-21 DIAGNOSIS — Z96651 Presence of right artificial knee joint: Secondary | ICD-10-CM | POA: Diagnosis not present

## 2020-11-21 DIAGNOSIS — M25561 Pain in right knee: Secondary | ICD-10-CM | POA: Diagnosis not present

## 2020-11-21 DIAGNOSIS — I4949 Other premature depolarization: Secondary | ICD-10-CM | POA: Diagnosis not present

## 2020-11-25 DIAGNOSIS — M6281 Muscle weakness (generalized): Secondary | ICD-10-CM | POA: Diagnosis not present

## 2020-11-25 DIAGNOSIS — M25561 Pain in right knee: Secondary | ICD-10-CM | POA: Diagnosis not present

## 2020-11-25 DIAGNOSIS — M25661 Stiffness of right knee, not elsewhere classified: Secondary | ICD-10-CM | POA: Diagnosis not present

## 2020-11-25 DIAGNOSIS — Z96651 Presence of right artificial knee joint: Secondary | ICD-10-CM | POA: Diagnosis not present

## 2020-11-27 DIAGNOSIS — M1711 Unilateral primary osteoarthritis, right knee: Secondary | ICD-10-CM | POA: Diagnosis not present

## 2020-12-04 DIAGNOSIS — Z96651 Presence of right artificial knee joint: Secondary | ICD-10-CM | POA: Diagnosis not present

## 2020-12-04 DIAGNOSIS — M25561 Pain in right knee: Secondary | ICD-10-CM | POA: Diagnosis not present

## 2020-12-04 DIAGNOSIS — M25661 Stiffness of right knee, not elsewhere classified: Secondary | ICD-10-CM | POA: Diagnosis not present

## 2020-12-04 DIAGNOSIS — M6281 Muscle weakness (generalized): Secondary | ICD-10-CM | POA: Diagnosis not present

## 2020-12-07 DIAGNOSIS — M25661 Stiffness of right knee, not elsewhere classified: Secondary | ICD-10-CM | POA: Diagnosis not present

## 2020-12-07 DIAGNOSIS — M25561 Pain in right knee: Secondary | ICD-10-CM | POA: Diagnosis not present

## 2020-12-07 DIAGNOSIS — M6281 Muscle weakness (generalized): Secondary | ICD-10-CM | POA: Diagnosis not present

## 2020-12-07 DIAGNOSIS — Z96651 Presence of right artificial knee joint: Secondary | ICD-10-CM | POA: Diagnosis not present

## 2020-12-12 DIAGNOSIS — M25661 Stiffness of right knee, not elsewhere classified: Secondary | ICD-10-CM | POA: Diagnosis not present

## 2020-12-12 DIAGNOSIS — M6281 Muscle weakness (generalized): Secondary | ICD-10-CM | POA: Diagnosis not present

## 2020-12-12 DIAGNOSIS — Z96651 Presence of right artificial knee joint: Secondary | ICD-10-CM | POA: Diagnosis not present

## 2020-12-12 DIAGNOSIS — M25561 Pain in right knee: Secondary | ICD-10-CM | POA: Diagnosis not present

## 2022-01-23 ENCOUNTER — Ambulatory Visit: Payer: PPO | Admitting: Sports Medicine

## 2022-01-23 VITALS — BP 124/70 | Ht 61.0 in | Wt 160.0 lb

## 2022-01-23 DIAGNOSIS — M170 Bilateral primary osteoarthritis of knee: Secondary | ICD-10-CM | POA: Diagnosis not present

## 2022-01-23 NOTE — Assessment & Plan Note (Signed)
Documented OA by MRI S/p TKR on right and not anxious to have the left done  We plan to use both quad and hip strengthening as well as some stationary biking See if she feels stable enough after that

## 2022-01-23 NOTE — Progress Notes (Signed)
Melissa Welch- Melissa Welch - 67 y.o. female MRN [id number]  Date of birth: [birth year]    CHIEF COMPLAINT:   Left shoulder pain and left knee pain    SUBJECTIVE:   HPI:  Pleasant 67 year old female comes to clinic to be evaluated for left shoulder pain and left knee pain.  L shoulder -reports a sharp pain over the lateral aspect of the left shoulder.  Has been intermittently bothersome to her over the last several months.  Hurts the worst when she sleeps on it at night.  She also has some difficulty with overhead movements.  Denies any inciting injury or trauma.  Denies any radiation of the pain down the arm.  Denies any numbness or tingling down the arm.  She has not tried taking any medicines for it.  She has not tried any therapies.   L knee -has a several year long history of knee pain.  She actually had MRIs done in 2022 of her knees bilaterally.  These showed meniscus tears and severe tricompartment degenerative changes bilaterally.  She had previously tried therapy, cortisone injections, gel injections, and PRP injections to treat the knees but did not have any success with any of that.  She eventually decided to undergo right total knee replacement with Dr. Mayer Camel in spring 2023.  She did generally well postoperatively.  She still feels that the right knee is a little weak and gives out.  However today it is the left knee that is bothering the most.  It hurts when she is moving from a seated to a standing position.  It also hurts when she lays on it to sleep at night.  Most of her pain is over the medial part of the joint.  She describes it as a dull constant ache.  Denies any mechanical catching or locking or clicking of the left knee.  Denies any numbness or tingling down the leg.  She also has not tried any medicines or therapies for this.    ROS:     See HPI  PERTINENT  PMH / PSH FH / / SH:  Past Medical, Surgical, Social, and Family History Reviewed & Updated in the EMR.  Pertinent  findings include:  Left knee osteoarthritis  OBJECTIVE: BP 124/70   Ht '5\' 1"'$  (1.549 m)   Wt 160 lb (72.6 kg)   BMI 30.23 kg/m   Physical Exam:  Vital signs are reviewed.  GEN: Alert and oriented, NAD Pulm: Breathing unlabored PSY: normal mood, congruent affect  MSK: Left shoulder -no obvious deformity.  She is mildly tender to palpation at the [facility] joint.  Nontender to palpation at the biceps tendon in the bicipital groove.  She has full range of motion in abduction and forward flexion but does have a positive painful arc.  External rotation to 30 degrees.  Internal rotation to mid lumbar spine.  4/5 strength with resisted internal and external rotation.  4/5 strength with supraspinatus testing.  She has a positive Hawkins test.  Positive empty can test.  Negative speeds test.  Neurovascular intact.  L Knee -no effusion.  No erythema.  She is tender to palpation at the medial joint line.  Nontender palpation lateral joint line.  She has full range of motion in flexion extension.  4/5 strength with resisted knee extension.  5/5 strength with resisted knee flexion.  No ligamentous instability with valgus or varus stressing.  Negative Lachman.  Negative McMurray.  Hips -she is tender to palpation at the greater trochanters bilaterally.  She has markedly weak hip abductors, 3/5 strength bilaterally.   ASSESSMENT & PLAN:  Left Shoulder Impingement  -Patient's history exam is consistent with impingement.  Discussed treatment options.  Will have her start treat this conservatively with home exercises.  Encouraged to start with low weights and low number reps and slowly increase over the next 4 to 6 weeks.  If no improvement, will do a full ultrasound scan of her shoulder to evaluate her cuff.  I do not think she has a major cuff tear but she may have a component of chronic cuff tendinitis at play here as well.  The patient is generally not interested in medicine/shots, but would consider an oral  anti-inflammatory or subacromial corticosteroid injection at the next visit as well.  2.  Left knee osteoarthritis  -Known history of left knee OA.  I think this is likely getting worse over time.  However, she has notably weak hip abductors.  She would like to treat this conservatively as she has no interest in undergoing a second knee replacement.  I think it is reasonable to start by strengthening her quads and hip abductors.  Will start her with a home exercise plan for this as well.  Will check her back in 4 to 6 weeks.  If no improvement, we can get her into formal physical therapy or consider an intra-articular corticosteroid injection.  Knee replacement will still remain an option on the horizon should she change her mind about that.  Dortha Kern, MD PGY-4, Sports Medicine Fellow [place]  I observed and examined the patient with the [facility] resident and agree with assessment and plan.  Note reviewed and modified by me. [name], MD

## 2022-02-04 ENCOUNTER — Ambulatory Visit: Payer: PPO | Admitting: Sports Medicine
